# Patient Record
Sex: Male | Born: 1970 | Race: White | Hispanic: No | Marital: Married | Smoking: Former smoker
Health system: Southern US, Community
[De-identification: ages and names within clinical notes are randomized; demographics above are authoritative.]

## PROBLEM LIST (undated history)

## (undated) DIAGNOSIS — I10 Essential (primary) hypertension: Secondary | ICD-10-CM

## (undated) DIAGNOSIS — J45909 Unspecified asthma, uncomplicated: Secondary | ICD-10-CM

## (undated) DIAGNOSIS — E785 Hyperlipidemia, unspecified: Secondary | ICD-10-CM

## (undated) DIAGNOSIS — G4730 Sleep apnea, unspecified: Secondary | ICD-10-CM

## (undated) HISTORY — PX: LIPOMA EXCISION: SHX5283

## (undated) HISTORY — DX: Sleep apnea, unspecified: G47.30

## (undated) HISTORY — PX: APPENDECTOMY: SHX54

## (undated) HISTORY — DX: Essential (primary) hypertension: I10

## (undated) HISTORY — PX: ANTERIOR CERVICAL DECOMP/DISCECTOMY FUSION: SHX1161

## (undated) HISTORY — DX: Unspecified asthma, uncomplicated: J45.909

## (undated) HISTORY — DX: Hyperlipidemia, unspecified: E78.5

## (undated) HISTORY — PX: NASAL SEPTUM SURGERY: SHX37

## (undated) HISTORY — PX: BACK SURGERY: SHX140

---

## 2004-04-22 ENCOUNTER — Emergency Department: Payer: Self-pay | Admitting: Unknown Physician Specialty

## 2004-07-05 ENCOUNTER — Ambulatory Visit: Payer: Self-pay | Admitting: Family Medicine

## 2006-06-05 ENCOUNTER — Ambulatory Visit: Payer: Self-pay | Admitting: Family Medicine

## 2009-08-22 ENCOUNTER — Ambulatory Visit: Payer: Self-pay | Admitting: Family Medicine

## 2009-09-23 ENCOUNTER — Ambulatory Visit: Payer: Self-pay | Admitting: Unknown Physician Specialty

## 2010-12-25 ENCOUNTER — Ambulatory Visit: Payer: Self-pay | Admitting: Internal Medicine

## 2011-11-30 HISTORY — PX: OTHER SURGICAL HISTORY: SHX169

## 2011-12-19 ENCOUNTER — Ambulatory Visit: Payer: Self-pay | Admitting: Cardiology

## 2013-10-09 ENCOUNTER — Ambulatory Visit: Payer: Self-pay

## 2014-11-16 ENCOUNTER — Ambulatory Visit (INDEPENDENT_AMBULATORY_CARE_PROVIDER_SITE_OTHER): Payer: Commercial Managed Care - PPO | Admitting: Family Medicine

## 2014-11-16 ENCOUNTER — Encounter: Payer: Self-pay | Admitting: Family Medicine

## 2014-11-16 VITALS — BP 136/84 | HR 65 | Temp 97.6°F | Ht 70.7 in | Wt 299.0 lb

## 2014-11-16 DIAGNOSIS — R9431 Abnormal electrocardiogram [ECG] [EKG]: Secondary | ICD-10-CM | POA: Insufficient documentation

## 2014-11-16 DIAGNOSIS — R42 Dizziness and giddiness: Secondary | ICD-10-CM | POA: Insufficient documentation

## 2014-11-16 NOTE — Assessment & Plan Note (Signed)
Non-specific. Possibly cardiac related. Possibly thyroid related, possibly due to his asthma. Will go back and see his cardiologist on Thursday- appointment made today. Await lab results, should be back tomorrow. Follow up pending cardiology appointment.

## 2014-11-16 NOTE — Assessment & Plan Note (Addendum)
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 Changed from last EKG we have on file which was from 2013- no other recent EKGs on file. States that he had a clean cath with Dr. Cassie FreerParachos in 2014. Given symptoms, we will get him back in to see Cardiology. Appointment scheduled for 11/18/14 at 10:00AM. Warning signs for which he should go to the ER discussed.

## 2014-11-16 NOTE — Progress Notes (Signed)
BP 136/84 mmHg  Pulse 65  Temp(Src) 97.6 F (36.4 C)  Ht 5' 10.7" (1.796 m)  Wt 299 lb (135.626 kg)  BMI 42.05 kg/m2  SpO2 97%   Subjective:    Patient ID: Adam Beard, male    DOB: Feb 14, 1970, 44 y.o.   MRN: [id number]  HPI: Adam Beard is a 44 y.o. male  Chief Complaint  Patient presents with  . Dizziness   DIZZINESS- Friday through Sunday, has had a cold that he thinks went into bronchitis for about the past month and has just not been feeling like himself. Notes that his wife is concerned and wanted him to come in and get checked out. Saw cardiology about 2 years ago and had a cath which was negative. Does note that both his sister and his brother have been diagnosed with hypothyroid and he doesn't know the last time he was checked. Duration: 3-5 days Description of symptoms: lightheaded and just not feeling good Duration of episode: minutes Dizziness frequency: no history of the same Provoking factors: not eating Aggravating factors:  none Triggered by rolling over in bed: no Triggered by bending over: no Aggravated by head movement: yes Aggravated by exertion, coughing, loud noises: yes Recent head injury: no Recent or current viral symptoms: yes History of vasovagal episodes: no Nausea: no Vomiting: no Tinnitus: yes Hearing loss: yes Aural fullness: yes Headache: yes Photophobia/phonophobia: yes Unsteady gait: no Postural instability: no Diplopia, dysarthria, dysphagia or weakness: no Related to exertion: no Pallor: no Diaphoresis: yes Dyspnea: yes Chest pain: yes- thinks that it is due to coughing, only happens with moving   Relevant past medical, surgical, family and social history reviewed and updated as indicated. Interim medical history since our last visit reviewed. Allergies and medications reviewed and updated.  Review of Systems  Constitutional: Positive for diaphoresis, activity change and fatigue. Negative for fever, chills, appetite  change and unexpected weight change.  HENT: Positive for hearing loss and tinnitus. Negative for congestion, dental problem, drooling, ear discharge, ear pain, facial swelling, mouth sores, nosebleeds, postnasal drip, rhinorrhea, sinus pressure, sneezing, sore throat, trouble swallowing and voice change.   Respiratory: Positive for cough, chest tightness, shortness of breath and wheezing. Negative for apnea, choking and stridor.   Cardiovascular: Positive for chest pain and palpitations. Negative for leg swelling.  Gastrointestinal: Negative.   Neurological: Positive for dizziness, weakness, light-headedness and headaches. Negative for tremors, seizures, syncope, facial asymmetry, speech difficulty and numbness.  Hematological: Negative.   Psychiatric/Behavioral: Negative.     Per HPI unless specifically indicated above     Objective:    BP 136/84 mmHg  Pulse 65  Temp(Src) 97.6 F (36.4 C)  Ht 5' 10.7" (1.796 m)  Wt 299 lb (135.626 kg)  BMI 42.05 kg/m2  SpO2 97%  Wt Readings from Last 3 Encounters:  11/16/14 299 lb (135.626 kg)  06/29/14 286 lb (129.729 kg)    Physical Exam  Constitutional: He is oriented to person, place, and time. He appears well-developed and well-nourished. No distress.  HENT:  Head: Normocephalic and atraumatic.  Right Ear: Hearing normal.  Left Ear: Hearing normal.  Nose: Nose normal.  Eyes: Conjunctivae and lids are normal. Right eye exhibits no discharge. Left eye exhibits no discharge. No scleral icterus.  Neck: Normal range of motion. Neck supple. No JVD present. No tracheal deviation present. No thyromegaly present.  Cardiovascular: Normal rate, regular rhythm, normal heart sounds and intact distal pulses.  Exam reveals no gallop and no friction  rub.   No murmur heard. Pulmonary/Chest: Effort normal. No accessory muscle usage or stridor. No respiratory distress. He has decreased breath sounds. He has no wheezes. He has no rhonchi. He has no rales.   Musculoskeletal: Normal range of motion.  Lymphadenopathy:    He has no cervical adenopathy.  Neurological: He is alert and oriented to person, place, and time.  Skin: Skin is intact. No rash noted.  Psychiatric: He has a normal mood and affect. His speech is normal and behavior is normal. Judgment and thought content normal. Cognition and memory are normal.    No results found for this or any previous visit.    Assessment & Plan:   Problem List Items Addressed This Visit      Other   Dizziness - Primary    Non-specific. Possibly cardiac related. Possibly thyroid related, possibly due to his asthma. Will go back and see his cardiologist on Thursday- appointment made today. Await lab results, should be back tomorrow. Follow up pending cardiology appointment.       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 Changed from last EKG we have on file which was from 2013- no other recent EKGs on file. States that he had a clean cath with Dr. Cassie FreerParachos in 2014. Given symptoms, we will get him back in to see Cardiology. Appointment scheduled for 11/18/14 at 10:00AM. Warning signs for which he should go to the ER discussed.           Follow up plan: Return Following cardiology appointment.

## 2014-11-17 ENCOUNTER — Telehealth: Payer: Self-pay

## 2014-11-17 LAB — COMPREHENSIVE METABOLIC PANEL
A/G RATIO: 2.6 — AB (ref 1.1–2.5)
ALBUMIN: 4.6 g/dL (ref 3.5–5.5)
ALT: 46 IU/L — ABNORMAL HIGH (ref 0–44)
AST: 23 IU/L (ref 0–40)
Alkaline Phosphatase: 67 IU/L (ref 39–117)
BUN / CREAT RATIO: 14 (ref 9–20)
BUN: 14 mg/dL (ref 6–24)
Bilirubin Total: 0.5 mg/dL (ref 0.0–1.2)
CALCIUM: 8.9 mg/dL (ref 8.7–10.2)
CO2: 23 mmol/L (ref 18–29)
Chloride: 105 mmol/L (ref 97–106)
Creatinine, Ser: 0.98 mg/dL (ref 0.76–1.27)
GFR, EST AFRICAN AMERICAN: 107 mL/min/{1.73_m2} (ref >59)
GFR, EST NON AFRICAN AMERICAN: 93 mL/min/{1.73_m2} (ref >59)
GLOBULIN, TOTAL: 1.8 g/dL (ref 1.5–4.5)
Glucose: 92 mg/dL (ref 65–99)
POTASSIUM: 4.8 mmol/L (ref 3.5–5.2)
SODIUM: 140 mmol/L (ref 136–144)
Total Protein: 6.4 g/dL (ref 6.0–8.5)

## 2014-11-17 LAB — CBC WITH DIFFERENTIAL/PLATELET
BASOS: 1 %
Basophils Absolute: 0 10*3/uL (ref 0.0–0.2)
EOS (ABSOLUTE): 0.3 10*3/uL (ref 0.0–0.4)
EOS: 5 %
HEMATOCRIT: 46.1 % (ref 37.5–51.0)
Hemoglobin: 15.4 g/dL (ref 12.6–17.7)
IMMATURE GRANULOCYTES: 0 %
Immature Grans (Abs): 0 10*3/uL (ref 0.0–0.1)
LYMPHS ABS: 2.5 10*3/uL (ref 0.7–3.1)
Lymphs: 44 %
MCH: 29.7 pg (ref 26.6–33.0)
MCHC: 33.4 g/dL (ref 31.5–35.7)
MCV: 89 fL (ref 79–97)
MONOS ABS: 0.4 10*3/uL (ref 0.1–0.9)
Monocytes: 6 %
NEUTROS PCT: 44 %
Neutrophils Absolute: 2.6 10*3/uL (ref 1.4–7.0)
Platelets: 195 10*3/uL (ref 150–379)
RBC: 5.18 x10E6/uL (ref 4.14–5.80)
RDW: 14.4 % (ref 12.3–15.4)
WBC: 5.8 10*3/uL (ref 3.4–10.8)

## 2014-11-17 LAB — TSH: TSH: 2.9 u[IU]/mL (ref 0.450–4.500)

## 2014-11-17 NOTE — Telephone Encounter (Signed)
I want to hold off on that until he sees his cardiologist. His bloodwork here was also normal. Thanks!

## 2014-11-17 NOTE — Telephone Encounter (Signed)
Called patient to notify him that Dr. Laural BenesJohnson wanted to wait on the medications until he sees the cardiologist and that his blood work results were also normal. Patient agreed and said thank you. I asked if he had any more questions and he denied.

## 2014-11-17 NOTE — Telephone Encounter (Signed)
Patient called, he states that there was to be some medications sent into [facility] for him, they are not there. Also he wants to know if he needs to make a follow up visit after he is seen by the cardiologist.

## 2014-11-18 ENCOUNTER — Telehealth: Payer: Self-pay

## 2014-11-18 MED ORDER — AZITHROMYCIN 250 MG PO TABS: ORAL_TABLET | ORAL | Status: DC

## 2014-11-18 MED ORDER — PREDNISONE 10 MG PO TABS
ORAL_TABLET | ORAL | Status: DC
Start: 2014-11-18 — End: 2015-01-11

## 2014-11-18 MED ORDER — ALBUTEROL SULFATE HFA 108 (90 BASE) MCG/ACT IN AERS: 2.0000 | INHALATION_SPRAY | Freq: Four times a day (QID) | RESPIRATORY_TRACT | Status: DC | PRN

## 2014-11-18 NOTE — Telephone Encounter (Signed)
Patient called he was seen by cardiology, there were no changes in the EKG from 3 years ago. The cardiologist would like him to check back in with him in 3 weeks. He states that it is fine to go ahead and treat him for the bronchitis.

## 2014-11-18 NOTE — Telephone Encounter (Signed)
Rxs sent to his pharmacy. We'll recheck in 2 weeks to make sure his breathing is sounding better.

## 2015-01-11 ENCOUNTER — Ambulatory Visit (INDEPENDENT_AMBULATORY_CARE_PROVIDER_SITE_OTHER): Payer: Commercial Managed Care - PPO | Admitting: Family Medicine

## 2015-01-11 ENCOUNTER — Encounter: Payer: Self-pay | Admitting: Family Medicine

## 2015-01-11 VITALS — BP 146/83 | HR 61 | Temp 98.2°F | Ht 71.3 in | Wt 296.0 lb

## 2015-01-11 DIAGNOSIS — J329 Chronic sinusitis, unspecified: Secondary | ICD-10-CM | POA: Insufficient documentation

## 2015-01-11 MED ORDER — AMOXICILLIN 875 MG PO TABS: 875.0000 mg | ORAL_TABLET | Freq: Two times a day (BID) | ORAL | Status: DC

## 2015-01-11 NOTE — Assessment & Plan Note (Signed)
Discussed sinusitis care and treatment will continue Allegra and Sudafed may add Tylenol Mucinex Discuss other supportive measures Discuss use of antibiotics take until gone

## 2015-01-11 NOTE — Progress Notes (Signed)
BP 146/83 mmHg  Pulse 61  Temp(Src) 98.2 F (36.8 C)  Ht 5' 11.3" (1.811 m)  Wt 296 lb (134.265 kg)  BMI 40.94 kg/m2  SpO2 97%   Subjective:    Patient ID: Adam Beard, male    DOB: [birth year], 44 y.o.   MRN: [id number]  HPI: Adam Beard is a 44 y.o. male  Chief Complaint  Patient presents with  . URI   agent with 4-5 days of sinus pressure congestion developing and getting worse with scratchy throat developing some cough. Throat without swollen and tight Has been taking Allegra and Sudafed as been taking ibuprofen for sore throat  Relevant past medical, surgical, family and social history reviewed and updated as indicated. Interim medical history since our last visit reviewed. Allergies and medications reviewed and updated.  Review of Systems  Constitutional: Positive for chills, diaphoresis and fatigue. Negative for fever.  HENT: Positive for congestion, facial swelling, rhinorrhea, sinus pressure, sneezing and sore throat.        Ear fullness  Respiratory: Positive for cough and choking. Negative for chest tightness and wheezing.   Cardiovascular: Negative.     Per HPI unless specifically indicated above     Objective:    BP 146/83 mmHg  Pulse 61  Temp(Src) 98.2 F (36.8 C)  Ht 5' 11.3" (1.811 m)  Wt 296 lb (134.265 kg)  BMI 40.94 kg/m2  SpO2 97%  Wt Readings from Last 3 Encounters:  01/11/15 296 lb (134.265 kg)  11/16/14 299 lb (135.626 kg)  06/29/14 286 lb (129.729 kg)    Physical Exam  Constitutional: He is oriented to person, place, and time. He appears well-developed and well-nourished. No distress.  HENT:  Head: Normocephalic and atraumatic.  Right Ear: Hearing normal.  Left Ear: Hearing normal.  Nose: Nose normal.  Mouth/Throat: Oropharyngeal exudate present.  Eyes: Conjunctivae and lids are normal. Right eye exhibits no discharge. Left eye exhibits no discharge. No scleral icterus.  Cardiovascular: Normal rate, regular rhythm and  normal heart sounds.   Pulmonary/Chest: Effort normal and breath sounds normal. No respiratory distress.  Musculoskeletal: Normal range of motion.  Lymphadenopathy:    He has no cervical adenopathy.  Neurological: He is alert and oriented to person, place, and time.  Skin: Skin is intact. No rash noted.  Psychiatric: He has a normal mood and affect. His speech is normal and behavior is normal. Judgment and thought content normal. Cognition and memory are normal.    Results for orders placed or performed in visit on 11/16/14  CBC with Differential/Platelet  Result Value Ref Range   WBC 5.8 3.4 - 10.8 x10E3/uL   RBC 5.18 4.14 - 5.80 x10E6/uL   Hemoglobin 15.4 12.6 - 17.7 g/dL   Hematocrit 56.2 13.0 - 51.0 %   MCV 89 79 - 97 fL   MCH 29.7 26.6 - 33.0 pg   MCHC 33.4 31.5 - 35.7 g/dL   RDW 86.5 78.4 - 69.6 %   Platelets 195 150 - 379 x10E3/uL   Neutrophils 44 %   Lymphs 44 %   Monocytes 6 %   Eos 5 %   Basos 1 %   Neutrophils Absolute 2.6 1.4 - 7.0 x10E3/uL   Lymphocytes Absolute 2.5 0.7 - 3.1 x10E3/uL   Monocytes Absolute 0.4 0.1 - 0.9 x10E3/uL   EOS (ABSOLUTE) 0.3 0.0 - 0.4 x10E3/uL   Basophils Absolute 0.0 0.0 - 0.2 x10E3/uL   Immature Granulocytes 0 %   Immature Grans (Abs) 0.0  0.0 - 0.1 x10E3/uL  Comprehensive metabolic panel  Result Value Ref Range   Glucose 92 65 - 99 mg/dL   BUN 14 6 - 24 mg/dL   Creatinine, Ser 0.450.98 0.76 - 1.27 mg/dL   GFR calc non Af Amer 93 >59 mL/min/1.73   GFR calc Af Amer 107 >59 mL/min/1.73   BUN/Creatinine Ratio 14 9 - 20   Sodium 140 136 - 144 mmol/L   Potassium 4.8 3.5 - 5.2 mmol/L   Chloride 105 97 - 106 mmol/L   CO2 23 18 - 29 mmol/L   Calcium 8.9 8.7 - 10.2 mg/dL   Total Protein 6.4 6.0 - 8.5 g/dL   Albumin 4.6 3.5 - 5.5 g/dL   Globulin, Total 1.8 1.5 - 4.5 g/dL   Albumin/Globulin Ratio 2.6 (H) 1.1 - 2.5   Bilirubin Total 0.5 0.0 - 1.2 mg/dL   Alkaline Phosphatase 67 39 - 117 IU/L   AST 23 0 - 40 IU/L   ALT 46 (H) 0 - 44 IU/L   TSH  Result Value Ref Range   TSH 2.900 0.450 - 4.500 uIU/mL      Assessment & Plan:   Problem List Items Addressed This Visit      Respiratory   Sinusitis - Primary    Discussed sinusitis care and treatment will continue Allegra and Sudafed may add Tylenol Mucinex Discuss other supportive measures Discuss use of antibiotics take until gone      Relevant Medications   amoxicillin (AMOXIL) 875 MG tablet       Follow up plan: Return for Physical Exam appointment with Elnita Maxwellheryl.

## 2015-02-09 ENCOUNTER — Encounter: Payer: Commercial Managed Care - PPO | Admitting: Family Medicine

## 2015-02-22 ENCOUNTER — Encounter: Payer: Self-pay | Admitting: Family Medicine

## 2015-03-29 ENCOUNTER — Telehealth: Payer: Self-pay | Admitting: Family Medicine

## 2015-03-29 MED ORDER — AZITHROMYCIN 250 MG PO TABS: ORAL_TABLET | ORAL | Status: DC

## 2015-03-29 NOTE — Telephone Encounter (Signed)
Patient still feels that he may have bronchitis and wants to know if he can get antibiotic. Offer appointment but patient wanted me to send Dr. Dossie Beard a message first.

## 2015-04-11 ENCOUNTER — Telehealth: Payer: Self-pay | Admitting: Family Medicine

## 2015-04-11 NOTE — Telephone Encounter (Signed)
Patient will need to be seen.

## 2015-04-11 NOTE — Telephone Encounter (Signed)
Pt called in and would like to have something called in to [facility] for a sinus infection. He says he feels like he has the same thing he had when he came to see MAC on 01/11/15. While on the line i scheduled CPE for Friday.

## 2015-04-11 NOTE — Telephone Encounter (Signed)
Pt was unable to make an appt and said he will just try to wait it out.

## 2015-04-15 ENCOUNTER — Encounter: Payer: Commercial Managed Care - PPO | Admitting: Family Medicine

## 2015-06-07 ENCOUNTER — Ambulatory Visit
Admission: EM | Admit: 2015-06-07 | Discharge: 2015-06-07 | Disposition: A | Discharge status: Home / Self Care | Payer: Managed Care, Other (non HMO) | Attending: Family Medicine | Admitting: Family Medicine

## 2015-06-07 DIAGNOSIS — I10 Essential (primary) hypertension: Secondary | ICD-10-CM

## 2015-06-07 DIAGNOSIS — J0100 Acute maxillary sinusitis, unspecified: Secondary | ICD-10-CM | POA: Diagnosis not present

## 2015-06-07 LAB — RAPID STREP SCREEN (MED CTR MEBANE ONLY): Streptococcus, Group A Screen (Direct): NEGATIVE

## 2015-06-07 MED ORDER — AMOXICILLIN-POT CLAVULANATE 875-125 MG PO TABS: 1.0000 | ORAL_TABLET | Freq: Two times a day (BID) | ORAL | Status: DC

## 2015-06-07 MED ORDER — HYDROCOD POLST-CPM POLST ER 10-8 MG/5ML PO SUER: 5.0000 mL | Freq: Two times a day (BID) | ORAL | Status: DC

## 2015-06-07 NOTE — Discharge Instructions (Signed)
Sinus Rinse WHAT IS A SINUS RINSE? A sinus rinse is a simple home treatment that is used to rinse your sinuses with a sterile mixture of salt and water (saline solution). Sinuses are air-filled spaces in your skull behind the bones of your face and forehead that open into your nasal cavity. You will use the following:  Saline solution.  Neti pot or spray bottle. This releases the saline solution into your nose and through your sinuses. Neti pots and spray bottles can be purchased at your local pharmacy, a health food store, or online. WHEN WOULD I DO A SINUS RINSE? A sinus rinse can help to clear mucus, dirt, dust, or pollen from the nasal cavity. You may do a sinus rinse when you have a cold, a virus, nasal allergy symptoms, a sinus infection, or stuffiness in the nose or sinuses. If you are considering a sinus rinse:  Ask your child's health care provider before performing a sinus rinse on your child.  Do not do a sinus rinse if you have had ear or nasal surgery, ear infection, or blocked ears. HOW DO I DO A SINUS RINSE?  Wash your hands.  Disinfect your device according to the directions provided and then dry it.  Use the solution that comes with your device or one that is sold separately in stores. Follow the mixing directions on the package.  Fill your device with the amount of saline solution as directed by the device instructions.  Stand over a sink and tilt your head sideways over the sink.  Place the spout of the device in your upper nostril (the one closer to the ceiling).  Gently pour or squeeze the saline solution into the nasal cavity. The liquid should drain to the lower nostril if you are not overly congested.  Gently blow your nose. Blowing too hard may cause ear pain.  Repeat in the other nostril.  Clean and rinse your device with clean water and then air-dry it. ARE THERE RISKS OF A SINUS RINSE?  Sinus rinse is generally very safe and effective. However, there  are a few risks, which include:   A burning sensation in the sinuses. This may happen if you do not make the saline solution as directed. Make sure to follow all directions when making the saline solution.  Infection from contaminated water. This is rare, but possible.  Nasal irritation.   This information is not intended to replace advice given to you by your health care provider. Make sure you discuss any questions you have with your health care provider.   Document Released: 08/12/2013 Document Reviewed: 08/12/2013 Elsevier Interactive Patient Education 2016 [employer]  Sinusitis, Adult Sinusitis is redness, soreness, and inflammation of the paranasal sinuses. Paranasal sinuses are air pockets within the bones of your face. They are located beneath your eyes, in the middle of your forehead, and above your eyes. In healthy paranasal sinuses, mucus is able to drain out, and air is able to circulate through them by way of your nose. However, when your paranasal sinuses are inflamed, mucus and air can become trapped. This can allow bacteria and other germs to grow and cause infection. Sinusitis can develop quickly and last only a short time (acute) or continue over a long period (chronic). Sinusitis that lasts for more than 12 weeks is considered chronic. CAUSES Causes of sinusitis include:  Allergies.  Structural abnormalities, such as displacement of the cartilage that separates your nostrils (deviated septum), which can decrease the air flow   through your nose and sinuses and affect sinus drainage.  Functional abnormalities, such as when the small hairs (cilia) that line your sinuses and help remove mucus do not work properly or are not present. SIGNS AND SYMPTOMS Symptoms of acute and chronic sinusitis are the same. The primary symptoms are pain and pressure around the affected sinuses. Other symptoms include:  Upper toothache.  Earache.  Headache.  Bad breath.  Decreased  sense of smell and taste.  A cough, which worsens when you are lying flat.  Fatigue.  Fever.  Thick drainage from your nose, which often is green and may contain pus (purulent).  Swelling and warmth over the affected sinuses. DIAGNOSIS Your health care provider will perform a physical exam. During your exam, your health care provider may perform any of the following to help determine if you have acute sinusitis or chronic sinusitis:  Look in your nose for signs of abnormal growths in your nostrils (nasal polyps).  Tap over the affected sinus to check for signs of infection.  View the inside of your sinuses using an imaging device that has a light attached (endoscope). If your health care provider suspects that you have chronic sinusitis, one or more of the following tests may be recommended:  Allergy tests.  Nasal culture. A sample of mucus is taken from your nose, sent to a lab, and screened for bacteria.  Nasal cytology. A sample of mucus is taken from your nose and examined by your health care provider to determine if your sinusitis is related to an allergy. TREATMENT Most cases of acute sinusitis are related to a viral infection and will resolve on their own within 10 days. Sometimes, medicines are prescribed to help relieve symptoms of both acute and chronic sinusitis. These may include pain medicines, decongestants, nasal steroid sprays, or saline sprays. However, for sinusitis related to a bacterial infection, your health care provider will prescribe antibiotic medicines. These are medicines that will help kill the bacteria causing the infection. Rarely, sinusitis is caused by a fungal infection. In these cases, your health care provider will prescribe antifungal medicine. For some cases of chronic sinusitis, surgery is needed. Generally, these are cases in which sinusitis recurs more than 3 times per year, despite other treatments. HOME CARE INSTRUCTIONS  Drink plenty of  water. Water helps thin the mucus so your sinuses can drain more easily.  Use a humidifier.  Inhale steam 3-4 times a day (for example, sit in the bathroom with the shower running).  Apply a warm, moist washcloth to your face 3-4 times a day, or as directed by your health care provider.  Use saline nasal sprays to help moisten and clean your sinuses.  Take medicines only as directed by your health care provider.  If you were prescribed either an antibiotic or antifungal medicine, finish it all even if you start to feel better. SEEK IMMEDIATE MEDICAL CARE IF:  You have increasing pain or severe headaches.  You have nausea, vomiting, or drowsiness.  You have swelling around your face.  You have vision problems.  You have a stiff neck.  You have difficulty breathing.   This information is not intended to replace advice given to you by your health care provider. Make sure you discuss any questions you have with your health care provider.   Document Released: 01/15/2005 Document Revised: 02/05/2014 Document Reviewed: 01/30/2011 Elsevier Interactive Patient Education 2016 [employer]  

## 2015-06-07 NOTE — ED Provider Notes (Signed)
CSN: [id number]     Arrival date & time 06/07/15  4098 History   First MD Initiated Contact with Patient 06/07/15 0914     Chief Complaint  Patient presents with  . Sore Throat    Sinus sx x 2 weeks. Started on Sunday with left sided sore throat and into left ear. Pain 8/10. Low grade fever.    (Consider location/radiation/quality/duration/timing/severity/associated sxs/prior Treatment) HPI   This is a 45 year old male who presents with a two-week history of sinus symptoms with yellow blood-tinged mucus from his nose as well as coughing with yellow sputum production. Also complaining of left-sided sore throat into his left ear. His pain is an 8 out of 10. His wife who accompanies him states that he has been running a low-grade temperature at home and he does have 99.3 temperature today. He started experiencing left-sided pain in his throat just inferior to his ear and along the jawline. This started 2 days ago. He has had a 2 recent bouts of sinusitis and sore throat treated by Dr. Laural Benes his primary care physician.    Past Medical History  Diagnosis Date  . Hypertension   . Sleep apnea   . Hyperlipidemia   . Asthma    Past Surgical History  Procedure Laterality Date  . Appendectomy    . Back surgery      Ruptured Disk  . Nasal septum surgery    . Lipoma excision      Neck  . Heart catheter  11/2011   Family History  Problem Relation Age of Onset  . Hypertension Mother   . Cancer Father     Brain  . Hypertension Father   . Cancer Sister   . Hypertension Brother    Social History  Substance Use Topics  . Smoking status: Never Smoker   . Smokeless tobacco: Current User    Types: Chew  . Alcohol Use: Yes     Comment: one or less per year    Review of Systems  Constitutional: Positive for fever, activity change and fatigue. Negative for chills and appetite change.  HENT: Positive for congestion, ear pain, postnasal drip, rhinorrhea, sinus pressure and sore throat.    Respiratory: Positive for cough. Negative for shortness of breath, wheezing and stridor.   All other systems reviewed and are negative.   Allergies  Review of patient's allergies indicates no known allergies.  Home Medications   Prior to Admission medications   Medication Sig Start Date End Date Taking? Authorizing Provider  albuterol (PROVENTIL HFA;VENTOLIN HFA) 108 (90 BASE) MCG/ACT inhaler Inhale 2 puffs into the lungs every 6 (six) hours as needed for wheezing or shortness of breath. 11/18/14  Yes Megan P Johnson, DO  azelastine (ASTELIN) 0.1 % nasal spray Place into both nostrils 2 (two) times daily. Use in each nostril as directed   Yes Historical Provider, MD  fexofenadine (ALLEGRA) 180 MG tablet Take 180 mg by mouth daily.   Yes Historical Provider, MD  amoxicillin (AMOXIL) 875 MG tablet Take 1 tablet (875 mg total) by mouth 2 (two) times daily. 01/11/15   Steele Sizer, MD  amoxicillin-clavulanate (AUGMENTIN) 875-125 MG tablet Take 1 tablet by mouth every 12 (twelve) hours. 06/07/15   Lutricia Feil, PA-C  azithromycin (ZITHROMAX) 250 MG tablet 2 now then 1 a day 03/29/15   Steele Sizer, MD  chlorpheniramine-HYDROcodone [facility] PENNKINETIC ER) 10-8 MG/5ML SUER Take 5 mLs by mouth 2 (two) times daily. 06/07/15   Lutricia Feil,  PA-C  naproxen sodium (ANAPROX) 220 MG tablet Take 220 mg by mouth daily.    Historical Provider, MD   Meds Ordered and Administered this Visit  Medications - No data to display  BP 140/101 mmHg  Pulse 77  Temp(Src) 99.3 F (37.4 C) (Oral)  Resp 20  Ht 6\' 1"  (1.854 m)  Wt 290 lb (131.543 kg)  BMI 38.27 kg/m2  SpO2 98% No data found.   Physical Exam  Constitutional: He is oriented to person, place, and time. He appears well-developed and well-nourished. No distress.  HENT:  Head: Normocephalic and atraumatic.  Right Ear: External ear normal.  Left Ear: External ear normal.  Nose: Nose normal.  Mouth/Throat: Oropharynx is clear and  moist. No oropharyngeal exudate.  Eyes: Conjunctivae are normal. Pupils are equal, round, and reactive to light.  Neck: Normal range of motion. Neck supple.  Pulmonary/Chest: Effort normal and breath sounds normal. No respiratory distress. He has no wheezes. He has no rales.  Musculoskeletal: Normal range of motion. He exhibits no edema or tenderness.  Lymphadenopathy:    He has no cervical adenopathy.  Neurological: He is alert and oriented to person, place, and time.  Skin: Skin is warm and dry. He is not diaphoretic.  Psychiatric: He has a normal mood and affect. His behavior is normal. Judgment and thought content normal.  Nursing note and vitals reviewed.   ED Course  Procedures (including critical care time)  Labs Review Labs Reviewed  RAPID STREP SCREEN (NOT AT [facility])  CULTURE, GROUP A STREP [facility]    Imaging Review No results found.   Visual Acuity Review  Right Eye Distance:   Left Eye Distance:   Bilateral Distance:    Right Eye Near:   Left Eye Near:    Bilateral Near:         MDM   1. Acute maxillary sinusitis, recurrence not specified   2. Essential hypertension    Discharge Medication List as of 06/07/2015  9:58 AM    START taking these medications   Details  amoxicillin-clavulanate (AUGMENTIN) 875-125 MG tablet Take 1 tablet by mouth every 12 (twelve) hours., Starting 06/07/2015, Until Discontinued, Normal    chlorpheniramine-HYDROcodone (TUSSIONEX PENNKINETIC ER) 10-8 MG/5ML SUER Take 5 mLs by mouth 2 (two) times daily., Starting 06/07/2015, Until Discontinued, Print      Plan: 1. Test/x-ray results and diagnosis reviewed with patient 2. rx as per orders; risks, benefits, potential side effects reviewed with patient 3. Recommend supportive treatment with Rest and fluids. SHe has begun using Flonase which I have encouraged her about the next month. SHe also has been using sinus rinsing. I told her that since she's had frequent sinus infections she  should be evaluated by an ENT to see if there is a structural problem. SHe has a history of sinus surgery. SHe is not improving or is worsening she should follow-up with her primary care physician. 4. F/u prn if symptoms worsen or don't improve     Lutricia FeilWilliam P Roemer, PA-C 06/07/15 2042

## 2015-06-09 LAB — CULTURE, GROUP A STREP (THRC)

## 2017-01-17 ENCOUNTER — Encounter: Payer: Self-pay | Admitting: Family Medicine

## 2017-01-17 ENCOUNTER — Ambulatory Visit (INDEPENDENT_AMBULATORY_CARE_PROVIDER_SITE_OTHER): Payer: Managed Care, Other (non HMO) | Admitting: Family Medicine

## 2017-01-17 VITALS — BP 151/92 | HR 58 | Temp 98.9°F | Ht 72.0 in | Wt 314.0 lb

## 2017-01-17 DIAGNOSIS — R030 Elevated blood-pressure reading, without diagnosis of hypertension: Secondary | ICD-10-CM | POA: Diagnosis not present

## 2017-01-17 DIAGNOSIS — Z0000 Encounter for general adult medical examination without abnormal findings: Secondary | ICD-10-CM | POA: Diagnosis not present

## 2017-01-17 DIAGNOSIS — F4322 Adjustment disorder with anxiety: Secondary | ICD-10-CM

## 2017-01-17 DIAGNOSIS — Z13220 Encounter for screening for lipoid disorders: Secondary | ICD-10-CM

## 2017-01-17 MED ORDER — HYDROXYZINE HCL 25 MG PO TABS: 25.0000 mg | ORAL_TABLET | Freq: Three times a day (TID) | ORAL | 1 refills | Status: DC | PRN

## 2017-01-17 NOTE — Progress Notes (Signed)
BP (!) 151/92 (BP Location: Left Arm, Cuff Size: Large)   Pulse (!) 58   Temp 98.9 F (37.2 C)   Ht 6' (1.829 m)   Wt (!) 314 lb (142.4 kg)   SpO2 96%   BMI 42.59 kg/m    Subjective:    Patient ID: Adam Beard, male    DOB: 23-Sep-1970, 46 y.o.   MRN: [id number]  HPI: Adam Beard is a 46 y.o. male presenting on 01/17/2017 for comprehensive medical examination. Current medical complaints include:  ELEVATED BLOOD PRESSURE- just got back from a mens hunting retreat when he ate a lot of processed meat and ate crap Duration of elevated BP: unknown BP monitoring frequency: not checking Previous BP meds: no Recent stressors: yes Family history of hypertension: yes Recurrent headaches: no Visual changes: no Palpitations: no  Dyspnea: no Chest pain: no Lower extremity edema: no Dizzy/lightheaded: no Transient ischemic attacks: no  ANXIETY/STRESS- has trouble with crowds and loud places Duration: 8-9 months Status:exacerbated Anxious mood: yes  Excessive worrying: no Irritability: yes  Sweating: no Nausea: no Palpitations:yes Hyperventilation: no Panic attacks: no Agoraphobia: yes  Obscessions/compulsions: no Depressed mood: no Depression screen PHQ 2/9 01/17/2017  Decreased Interest 1  Down, Depressed, Hopeless 0  PHQ - 2 Score 1  Altered sleeping 1  Tired, decreased energy 1  Change in appetite 0  Feeling bad or failure about yourself  0  Trouble concentrating 0  Moving slowly or fidgety/restless 0  Suicidal thoughts 0  PHQ-9 Score 3  Difficult doing work/chores Somewhat difficult   Anhedonia: no Weight changes: yes Insomnia: yes hard to fall asleep and hard to stay asleep Hypersomnia: no Fatigue/loss of energy: yes Feelings of worthlessness: no Feelings of guilt: no Impaired concentration/indecisiveness: no Suicidal ideations: no  Crying spells: no Recent Stressors/Life Changes: yes   Relationship problems: no   Family stress: yes    Financial stress: no    Job stress: no    Recent death/loss: no  He currently lives with: wife Interim Problems from his last visit: no  Depression Screen done today and results listed below:  Depression screen [facility] 2/9 01/17/2017  Decreased Interest 1  Down, Depressed, Hopeless 0  PHQ - 2 Score 1  Altered sleeping 1  Tired, decreased energy 1  Change in appetite 0  Feeling bad or failure about yourself  0  Trouble concentrating 0  Moving slowly or fidgety/restless 0  Suicidal thoughts 0  PHQ-9 Score 3  Difficult doing work/chores Somewhat difficult    Past Medical History:  Past Medical History:  Diagnosis Date  . Asthma   . Hyperlipidemia   . Hypertension   . Sleep apnea     Surgical History:  Past Surgical History:  Procedure Laterality Date  . APPENDECTOMY    . BACK SURGERY     Ruptured Disk  . heart catheter  11/2011  . LIPOMA EXCISION     Neck  . NASAL SEPTUM SURGERY      Medications:  Current Outpatient Medications on File Prior to Visit  Medication Sig  . oxymetazoline (MUCINEX SINUS-MAX FULL FORCE) 0.05 % nasal spray Place 1 spray into both nostrils 2 (two) times daily.  Marland Kitchen albuterol (PROVENTIL HFA;VENTOLIN HFA) 108 (90 BASE) MCG/ACT inhaler Inhale 2 puffs into the lungs every 6 (six) hours as needed for wheezing or shortness of breath.  . fexofenadine (ALLEGRA) 180 MG tablet Take 180 mg by mouth daily.  . naproxen sodium (ANAPROX) 220 MG tablet Take  220 mg by mouth daily.   No current facility-administered medications on file prior to visit.     Allergies:  No Known Allergies  Social History:  Social History   Socioeconomic History  . Marital status: Married    Spouse name: Not on file  . Number of children: Not on file  . Years of education: Not on file  . Highest education level: Not on file  Social Needs  . Financial resource strain: Not on file  . Food insecurity - worry: Not on file  . Food insecurity - inability: Not on file  .  Transportation needs - medical: Not on file  . Transportation needs - non-medical: Not on file  Occupational History  . Not on file  Tobacco Use  . Smoking status: Never Smoker  . Smokeless tobacco: Current User    Types: Chew  Substance and Sexual Activity  . Alcohol use: Yes    Comment: one or less per year  . Drug use: No  . Sexual activity: Not on file  Other Topics Concern  . Not on file  Social History Narrative  . Not on file   Social History   Tobacco Use  Smoking Status Never Smoker  Smokeless Tobacco Current User  . Types: Chew   Social History   Substance and Sexual Activity  Alcohol Use Yes   Comment: one or less per year    Family History:  Family History  Problem Relation Age of Onset  . Hypertension Mother   . Cancer Father        Brain  . Hypertension Father   . Cancer Sister   . Hypertension Brother     Past medical history, surgical history, medications, allergies, family history and social history reviewed with patient today and changes made to appropriate areas of the chart.   Review of Systems  Constitutional: Negative.   HENT: Positive for tinnitus. Negative for congestion, ear discharge, ear pain, hearing loss, nosebleeds, sinus pain and sore throat.   Eyes: Negative.   Respiratory: Negative.  Negative for stridor.   Cardiovascular: Positive for palpitations. Negative for chest pain, orthopnea, claudication, leg swelling and PND.  Gastrointestinal: Positive for heartburn (with the recent trip). Negative for abdominal pain, blood in stool, constipation, diarrhea, melena, nausea and vomiting.  Genitourinary: Negative.   Musculoskeletal: Negative.   Skin: Negative.   Neurological: Positive for dizziness (laughing real hard makes him dizzy). Negative for tingling, tremors, sensory change, speech change, focal weakness, seizures, loss of consciousness and headaches.  Endo/Heme/Allergies: Negative.   Psychiatric/Behavioral: Negative for  depression, hallucinations, memory loss, substance abuse and suicidal ideas. The patient is nervous/anxious and has insomnia.     All other ROS negative except what is listed above and in the HPI.      Objective:    BP (!) 151/92 (BP Location: Left Arm, Cuff Size: Large)   Pulse (!) 58   Temp 98.9 F (37.2 C)   Ht 6' (1.829 m)   Wt (!) 314 lb (142.4 kg)   SpO2 96%   BMI 42.59 kg/m   Wt Readings from Last 3 Encounters:  01/17/17 (!) 314 lb (142.4 kg)  06/07/15 290 lb (131.5 kg)  01/11/15 296 lb (134.3 kg)    Physical Exam  Constitutional: He is oriented to person, place, and time. He appears well-developed and well-nourished. No distress.  HENT:  Head: Normocephalic and atraumatic.  Right Ear: Hearing, tympanic membrane, external ear and ear canal normal.  Left Ear:  Hearing, tympanic membrane, external ear and ear canal normal.  Nose: Nose normal.  Mouth/Throat: Uvula is midline, oropharynx is clear and moist and mucous membranes are normal. No oropharyngeal exudate.  Eyes: Conjunctivae, EOM and lids are normal. Pupils are equal, round, and reactive to light. Right eye exhibits no discharge. Left eye exhibits no discharge. No scleral icterus.  Neck: Normal range of motion. Neck supple. No JVD present. No tracheal deviation present. No thyromegaly present.  Cardiovascular: Normal rate, regular rhythm, normal heart sounds and intact distal pulses. Exam reveals no gallop and no friction rub.  No murmur heard. Pulmonary/Chest: Effort normal and breath sounds normal. No stridor. No respiratory distress. He has no wheezes. He has no rales. He exhibits no tenderness.  Abdominal: Soft. Bowel sounds are normal. He exhibits no distension and no mass. There is no tenderness. There is no rebound and no guarding. Hernia confirmed negative in the right inguinal area and confirmed negative in the left inguinal area.  Genitourinary: Testes normal and penis normal. Cremasteric reflex is present.  Right testis shows no mass, no swelling and no tenderness. Right testis is descended. Cremasteric reflex is not absent on the right side. Left testis shows no mass, no swelling and no tenderness. Left testis is descended. Cremasteric reflex is not absent on the left side. No phimosis, paraphimosis, hypospadias, penile erythema or penile tenderness. No discharge found.  Musculoskeletal: Normal range of motion. He exhibits no edema, tenderness or deformity.  Lymphadenopathy:    He has no cervical adenopathy.  Neurological: He is alert and oriented to person, place, and time. He has normal reflexes. He displays normal reflexes. No cranial nerve deficit. He exhibits normal muscle tone. Coordination normal.  Skin: Skin is warm, dry and intact. No rash noted. He is not diaphoretic. No erythema. No pallor.  Psychiatric: He has a normal mood and affect. His speech is normal and behavior is normal. Judgment and thought content normal. Cognition and memory are normal.  Nursing note and vitals reviewed.   Results for orders placed or performed during the hospital encounter of 06/07/15  Rapid strep screen  Result Value Ref Range   Streptococcus, Group A Screen (Direct) NEGATIVE NEGATIVE  Culture, group A strep  Result Value Ref Range   Specimen Description THROAT    Special Requests NONE Reflexed from [id number]    Culture NO BETA HEMOLYTIC STREPTOCOCCI ISOLATED    Report Status 06/09/2015 FINAL       Assessment & Plan:   Problem List Items Addressed This Visit    None    Visit Diagnoses    Routine general medical examination at a health care facility    -  Primary   Vaccines up to date. Screening labs checked today. Continue diet and exercise. Call with any concerns.    Relevant Orders   Bayer DCA Hb A1c Waived   CBC with Differential/Platelet   Comprehensive metabolic panel   Lipid Panel w/o Chol/HDL Ratio   Microalbumin, Urine Waived   Thyroid Panel With TSH   UA/M w/rflx Culture, Routine    Elevated blood pressure reading       Likely due to dietary indiscretions over the past couple of days- will work on [employer] diet and check microalbumin and recheck 1 month.    Relevant Orders   CBC with Differential/Platelet   Comprehensive metabolic panel   Microalbumin, Urine Waived   Thyroid Panel With TSH   Screening for cholesterol level       Lab to  be checked fasting. Orders in.    Relevant Orders   Lipid Panel w/o Chol/HDL Ratio   Adjustment disorder with anxious mood       Likely due to empty-nester. Will start breathing apps and hydroxyzine. Recheck 1 month.        Discussed aspirin prophylaxis for myocardial infarction prevention and decision was it was not indicated  LABORATORY TESTING:  Health maintenance labs ordered today as discussed above.   IMMUNIZATIONS:   - Tdap: Tetanus vaccination status reviewed: last tetanus booster within 10 years. - Influenza: Refused - Pneumovax: Not applicable  PATIENT COUNSELING:    Sexuality: Discussed sexually transmitted diseases, partner selection, use of condoms, avoidance of unintended pregnancy  and contraceptive alternatives.   Advised to avoid cigarette smoking.  I discussed with the patient that most people either abstain from alcohol or drink within safe limits (<=14/week and <=4 drinks/occasion for males, <=7/weeks and <= 3 drinks/occasion for females) and that the risk for alcohol disorders and other health effects rises proportionally with the number of drinks per week and how often a drinker exceeds daily limits.  Discussed cessation/primary prevention of drug use and availability of treatment for abuse.   Diet: Encouraged to adjust caloric intake to maintain  or achieve ideal body weight, to reduce intake of dietary saturated fat and total fat, to limit sodium intake by avoiding high sodium foods and not adding table salt, and to maintain adequate dietary potassium and calcium preferably from fresh fruits, vegetables, and  low-fat dairy products.    stressed the importance of regular exercise  Injury prevention: Discussed safety belts, safety helmets, smoke detector, smoking near bedding or upholstery.   Dental health: Discussed importance of regular tooth brushing, flossing, and dental visits.   Follow up plan: NEXT PREVENTATIVE PHYSICAL DUE IN 1 YEAR. Return in about 4 weeks (around 02/14/2017) for stress and blood pressure.

## 2017-01-17 NOTE — Patient Instructions (Addendum)
.The Mindfulness App.  .Headspace.  .Calm.  Marland Kitchen.MINDBODY.  .Buddhify.  .Insight Timer.  .Smiling Mind.  .Meditation Timer Pro.  Michaell Cowing.Aura    Health Maintenance, Male A healthy lifestyle and preventive care is important for your health and wellness. Ask your health care provider about what schedule of regular examinations is right for you. What should I know about weight and diet? Eat a Healthy Diet  Eat plenty of vegetables, fruits, whole grains, low-fat dairy products, and lean protein.  Do not eat a lot of foods high in solid fats, added sugars, or salt.  Maintain a Healthy Weight Regular exercise can help you achieve or maintain a healthy weight. You should:  Do at least 150 minutes of exercise each week. The exercise should increase your heart rate and make you sweat (moderate-intensity exercise).  Do strength-training exercises at least twice a week.  Watch Your Levels of Cholesterol and Blood Lipids  Have your blood tested for lipids and cholesterol every 5 years starting at 47 years of age. If you are at high risk for heart disease, you should start having your blood tested when you are 10541 years old. You may need to have your cholesterol levels checked more often if: ? Your lipid or cholesterol levels are high. ? You are older than 47 years of age. ? You are at high risk for heart disease.  What should I know about cancer screening? Many types of cancers can be detected early and may often be prevented. Lung Cancer  You should be screened every year for lung cancer if: ? You are a current smoker who has smoked for at least 30 years. ? You are a former smoker who has quit within the past 15 years.  Talk to your health care provider about your screening options, when you should start screening, and how often you should be screened.  Colorectal Cancer  Routine colorectal cancer screening usually begins at 47 years of age and should be repeated every 5-10 years until you are  47 years old. You may need to be screened more often if early forms of precancerous polyps or small growths are found. Your health care provider may recommend screening at an earlier age if you have risk factors for colon cancer.  Your health care provider may recommend using home test kits to check for hidden blood in the stool.  A small camera at the end of a tube can be used to examine your colon (sigmoidoscopy or colonoscopy). This checks for the earliest forms of colorectal cancer.  Prostate and Testicular Cancer  Depending on your age and overall health, your health care provider may do certain tests to screen for prostate and testicular cancer.  Talk to your health care provider about any symptoms or concerns you have about testicular or prostate cancer.  Skin Cancer  Check your skin from head to toe regularly.  Tell your health care provider about any new moles or changes in moles, especially if: ? There is a change in a mole's size, shape, or color. ? You have a mole that is larger than a pencil eraser.  Always use sunscreen. Apply sunscreen liberally and repeat throughout the day.  Protect yourself by wearing long sleeves, pants, a wide-brimmed hat, and sunglasses when outside.  What should I know about heart disease, diabetes, and high blood pressure?  If you are 2818-47 years of age, have your blood pressure checked every 3-5 years. If you are 47 years of age or older,  have your blood pressure checked every year. You should have your blood pressure measured twice-once when you are at a hospital or clinic, and once when you are not at a hospital or clinic. Record the average of the two measurements. To check your blood pressure when you are not at a hospital or clinic, you can use: ? An automated blood pressure machine at a pharmacy. ? A home blood pressure monitor.  Talk to your health care provider about your target blood pressure.  If you are between 55-52 years old, ask  your health care provider if you should take aspirin to prevent heart disease.  Have regular diabetes screenings by checking your fasting blood sugar level. ? If you are at a normal weight and have a low risk for diabetes, have this test once every three years after the age of 39. ? If you are overweight and have a high risk for diabetes, consider being tested at a younger age or more often.  A one-time screening for abdominal aortic aneurysm (AAA) by ultrasound is recommended for men aged 65-75 years who are current or former smokers. What should I know about preventing infection? Hepatitis B If you have a higher risk for hepatitis B, you should be screened for this virus. Talk with your health care provider to find out if you are at risk for hepatitis B infection. Hepatitis C Blood testing is recommended for:  Everyone born from [birth year] through 1966.  Anyone with known risk factors for hepatitis C.  Sexually Transmitted Diseases (STDs)  You should be screened each year for STDs including gonorrhea and chlamydia if: ? You are sexually active and are younger than 47 years of age. ? You are older than 47 years of age and your health care provider tells you that you are at risk for this type of infection. ? Your sexual activity has changed since you were last screened and you are at an increased risk for chlamydia or gonorrhea. Ask your health care provider if you are at risk.  Talk with your health care provider about whether you are at high risk of being infected with HIV. Your health care provider may recommend a prescription medicine to help prevent HIV infection.  What else can I do?  Schedule regular health, dental, and eye exams.  Stay current with your vaccines (immunizations).  Do not use any tobacco products, such as cigarettes, chewing tobacco, and e-cigarettes. If you need help quitting, ask your health care provider.  Limit alcohol intake to no more than 2 drinks per day.  One drink equals 12 ounces of beer, 5 ounces of wine, or 1 ounces of hard liquor.  Do not use street drugs.  Do not share needles.  Ask your health care provider for help if you need support or information about quitting drugs.  Tell your health care provider if you often feel depressed.  Tell your health care provider if you have ever been abused or do not feel safe at home. This information is not intended to replace advice given to you by your health care provider. Make sure you discuss any questions you have with your health care provider. Document Released: 07/14/2007 Document Revised: 09/14/2015 Document Reviewed: 10/19/2014 Elsevier Interactive Patient Education  2018 [employer].  DASH Eating Plan DASH stands for "Dietary Approaches to Stop Hypertension." The DASH eating plan is a healthy eating plan that has been shown to reduce high blood pressure (hypertension). It may also reduce your risk for type  2 diabetes, heart disease, and stroke. The DASH eating plan may also help with weight loss. What are tips for following this plan? General guidelines  Avoid eating more than 2,300 mg (milligrams) of salt (sodium) a day. If you have hypertension, you may need to reduce your sodium intake to 1,500 mg a day.  Limit alcohol intake to no more than 1 drink a day for nonpregnant women and 2 drinks a day for men. One drink equals 12 oz of beer, 5 oz of wine, or 1 oz of hard liquor.  Work with your health care provider to maintain a healthy body weight or to lose weight. Ask what an ideal weight is for you.  Get at least 30 minutes of exercise that causes your heart to beat faster (aerobic exercise) most days of the week. Activities may include walking, swimming, or biking.  Work with your health care provider or diet and nutrition specialist (dietitian) to adjust your eating plan to your individual calorie needs. Reading food labels  Check food labels for the amount of sodium per  serving. Choose foods with less than 5 percent of the Daily Value of sodium. Generally, foods with less than 300 mg of sodium per serving fit into this eating plan.  To find whole grains, look for the word "whole" as the first word in the ingredient list. Shopping  Buy products labeled as "low-sodium" or "no salt added."  Buy fresh foods. Avoid canned foods and premade or frozen meals. Cooking  Avoid adding salt when cooking. Use salt-free seasonings or herbs instead of table salt or sea salt. Check with your health care provider or pharmacist before using salt substitutes.  Do not fry foods. Cook foods using healthy methods such as baking, boiling, grilling, and broiling instead.  Cook with heart-healthy oils, such as olive, canola, soybean, or sunflower oil. Meal planning   Eat a balanced diet that includes: ? 5 or more servings of fruits and vegetables each day. At each meal, try to fill half of your plate with fruits and vegetables. ? Up to 6-8 servings of whole grains each day. ? Less than 6 oz of lean meat, poultry, or fish each day. A 3-oz serving of meat is about the same size as a deck of cards. One egg equals 1 oz. ? 2 servings of low-fat dairy each day. ? A serving of nuts, seeds, or beans 5 times each week. ? Heart-healthy fats. Healthy fats called Omega-3 fatty acids are found in foods such as flaxseeds and coldwater fish, like sardines, salmon, and mackerel.  Limit how much you eat of the following: ? Canned or prepackaged foods. ? Food that is high in trans fat, such as fried foods. ? Food that is high in saturated fat, such as fatty meat. ? Sweets, desserts, sugary drinks, and other foods with added sugar. ? Full-fat dairy products.  Do not salt foods before eating.  Try to eat at least 2 vegetarian meals each week.  Eat more home-cooked food and less restaurant, buffet, and fast food.  When eating at a restaurant, ask that your food be prepared with less salt  or no salt, if possible. What foods are recommended? The items listed may not be a complete list. Talk with your dietitian about what dietary choices are best for you. Grains Whole-grain or whole-wheat bread. Whole-grain or whole-wheat pasta. Brown rice. Orpah Cobb. Bulgur. Whole-grain and low-sodium cereals. Pita bread. Low-fat, low-sodium crackers. Whole-wheat flour tortillas. Vegetables Fresh or frozen vegetables (  raw, steamed, roasted, or grilled). Low-sodium or reduced-sodium tomato and vegetable juice. Low-sodium or reduced-sodium tomato sauce and tomato paste. Low-sodium or reduced-sodium canned vegetables. Fruits All fresh, dried, or frozen fruit. Canned fruit in natural juice (without added sugar). Meat and other protein foods Skinless chicken or Mal[place]iturkey. Ground chicken or [place]urkey. Pork with fat trimmed off. Fish and seafood. Egg whites. Dried beans, peas, or lentils. Unsalted nuts, nut butters, and seeds. Unsalted canned beans. Lean cuts of beef with fat trimmed off. Low-sodium, lean deli meat. Dairy Low-fat (1%) or fat-free (skim) milk. Fat-free, low-fat, or reduced-fat cheeses. Nonfat, low-sodium ricotta or cottage cheese. Low-fat or nonfat yogurt. Low-fat, low-sodium cheese. Fats and oils Soft margarine without trans fats. Vegetable oil. Low-fat, reduced-fat, or light mayonnaise and salad dressings (reduced-sodium). Canola, safflower, olive, soybean, and sunflower oils. Avocado. Seasoning and other foods Herbs. Spices. Seasoning mixes without salt. Unsalted popcorn and pretzels. Fat-free sweets. What foods are not recommended? The items listed may not be a complete list. Talk with your dietitian about what dietary choices are best for you. Grains Baked goods made with fat, such as croissants, muffins, or some breads. Dry pasta or rice meal packs. Vegetables Creamed or fried vegetables. Vegetables in a cheese sauce. Regular canned vegetables (not low-sodium or reduced-sodium).  Regular canned tomato sauce and paste (not low-sodium or reduced-sodium). Regular tomato and vegetable juice (not low-sodium or reduced-sodium). Rosita FirePickles. Olives. Fruits Canned fruit in a light or heavy syrup. Fried fruit. Fruit in cream or butter sauce. Meat and other protein foods Fatty cuts of meat. Ribs. Fried meat. Tomasa BlaseBacon. Sausage. Bologna and other processed lunch meats. Salami. Fatback. Hotdogs. Bratwurst. Salted nuts and seeds. Canned beans with added salt. Canned or smoked fish. Whole eggs or egg yolks. Chicken or [place]urkey with skin. Dairy Whole or 2% milk, cream, and half-and-half. Whole or full-fat cream cheese. Whole-fat or sweetened yogurt. Full-fat cheese. Nondairy creamers. Whipped toppings. Processed cheese and cheese spreads. Fats and oils Butter. Stick margarine. Lard. Shortening. Ghee. Bacon fat. Tropical oils, such as coconut, palm kernel, or palm oil. Seasoning and other foods Salted popcorn and pretzels. Onion salt, garlic salt, seasoned salt, table salt, and sea salt. Worcestershire sauce. Tartar sauce. Barbecue sauce. Teriyaki sauce. Soy sauce, including reduced-sodium. Steak sauce. Canned and packaged gravies. Fish sauce. Oyster sauce. Cocktail sauce. Horseradish that you find on the shelf. Ketchup. Mustard. Meat flavorings and tenderizers. Bouillon cubes. Hot sauce and Tabasco sauce. Premade or packaged marinades. Premade or packaged taco seasonings. Relishes. Regular salad dressings. Where to find more information:  National Heart, Lung, and Blood Institute: PopSteam.iswww.nhlbi.nih.gov  American Heart Association: www.heart.org Summary  The DASH eating plan is a healthy eating plan that has been shown to reduce high blood pressure (hypertension). It may also reduce your risk for type 2 diabetes, heart disease, and stroke.  With the DASH eating plan, you should limit salt (sodium) intake to 2,300 mg a day. If you have hypertension, you may need to reduce your sodium intake to 1,500 mg  a day.  When on the DASH eating plan, aim to eat more fresh fruits and vegetables, whole grains, lean proteins, low-fat dairy, and heart-healthy fats.  Work with your health care provider or diet and nutrition specialist (dietitian) to adjust your eating plan to your individual calorie needs. This information is not intended to replace advice given to you by your health care provider. Make sure you discuss any questions you have with your health care provider. Document Released: 01/04/2011 Document Revised: 01/09/2016  Document Reviewed: 01/09/2016 Elsevier Interactive Patient Education  [employer].

## 2017-01-30 ENCOUNTER — Other Ambulatory Visit: Payer: Managed Care, Other (non HMO)

## 2017-01-30 DIAGNOSIS — Z0000 Encounter for general adult medical examination without abnormal findings: Secondary | ICD-10-CM

## 2017-01-30 DIAGNOSIS — R030 Elevated blood-pressure reading, without diagnosis of hypertension: Secondary | ICD-10-CM

## 2017-01-30 DIAGNOSIS — Z13220 Encounter for screening for lipoid disorders: Secondary | ICD-10-CM

## 2017-01-30 LAB — UA/M W/RFLX CULTURE, ROUTINE
Bilirubin, UA: NEGATIVE
Glucose, UA: NEGATIVE
KETONES UA: NEGATIVE
LEUKOCYTES UA: NEGATIVE
Nitrite, UA: NEGATIVE
PH UA: 5 (ref 5.0–7.5)
Protein, UA: NEGATIVE
RBC UA: NEGATIVE
Specific Gravity, UA: 1.025 (ref 1.005–1.030)
UUROB: 0.2 mg/dL (ref 0.2–1.0)

## 2017-01-30 LAB — MICROALBUMIN, URINE WAIVED
CREATININE, URINE WAIVED: 300 mg/dL (ref 10–300)
Microalb, Ur Waived: 30 mg/L — ABNORMAL HIGH (ref 0–19)

## 2017-01-30 LAB — BAYER DCA HB A1C WAIVED: HB A1C: 5.5 % (ref <7.0)

## 2017-01-31 LAB — COMPREHENSIVE METABOLIC PANEL
ALBUMIN: 4.6 g/dL (ref 3.5–5.5)
ALK PHOS: 68 IU/L (ref 39–117)
ALT: 39 IU/L (ref 0–44)
AST: 25 IU/L (ref 0–40)
Albumin/Globulin Ratio: 1.9 (ref 1.2–2.2)
BUN / CREAT RATIO: 17 (ref 9–20)
BUN: 18 mg/dL (ref 6–24)
Bilirubin Total: 0.6 mg/dL (ref 0.0–1.2)
CO2: 22 mmol/L (ref 20–29)
CREATININE: 1.03 mg/dL (ref 0.76–1.27)
Calcium: 9.1 mg/dL (ref 8.7–10.2)
Chloride: 103 mmol/L (ref 96–106)
GFR calc Af Amer: 100 mL/min/{1.73_m2} (ref >59)
GFR calc non Af Amer: 86 mL/min/{1.73_m2} (ref >59)
GLOBULIN, TOTAL: 2.4 g/dL (ref 1.5–4.5)
Glucose: 118 mg/dL — ABNORMAL HIGH (ref 65–99)
Potassium: 4.1 mmol/L (ref 3.5–5.2)
SODIUM: 139 mmol/L (ref 134–144)
Total Protein: 7 g/dL (ref 6.0–8.5)

## 2017-01-31 LAB — LIPID PANEL W/O CHOL/HDL RATIO
Cholesterol, Total: 224 mg/dL — ABNORMAL HIGH (ref 100–199)
HDL: 37 mg/dL — AB (ref >39)
LDL Calculated: 129 mg/dL — ABNORMAL HIGH (ref 0–99)
TRIGLYCERIDES: 290 mg/dL — AB (ref 0–149)
VLDL CHOLESTEROL CAL: 58 mg/dL — AB (ref 5–40)

## 2017-01-31 LAB — CBC WITH DIFFERENTIAL/PLATELET
Basophils Absolute: 0 10*3/uL (ref 0.0–0.2)
Basos: 0 %
EOS (ABSOLUTE): 0.2 10*3/uL (ref 0.0–0.4)
EOS: 4 %
HEMATOCRIT: 46.6 % (ref 37.5–51.0)
Hemoglobin: 16.2 g/dL (ref 13.0–17.7)
IMMATURE GRANULOCYTES: 0 %
Immature Grans (Abs): 0 10*3/uL (ref 0.0–0.1)
LYMPHS ABS: 2.1 10*3/uL (ref 0.7–3.1)
Lymphs: 36 %
MCH: 29.7 pg (ref 26.6–33.0)
MCHC: 34.8 g/dL (ref 31.5–35.7)
MCV: 86 fL (ref 79–97)
MONOS ABS: 0.4 10*3/uL (ref 0.1–0.9)
Monocytes: 7 %
NEUTROS PCT: 53 %
Neutrophils Absolute: 3.2 10*3/uL (ref 1.4–7.0)
PLATELETS: 217 10*3/uL (ref 150–379)
RBC: 5.45 x10E6/uL (ref 4.14–5.80)
RDW: 14.7 % (ref 12.3–15.4)
WBC: 6 10*3/uL (ref 3.4–10.8)

## 2017-01-31 LAB — THYROID PANEL WITH TSH
FREE THYROXINE INDEX: 1.8 (ref 1.2–4.9)
T3 UPTAKE RATIO: 23 % — AB (ref 24–39)
T4, Total: 7.9 ug/dL (ref 4.5–12.0)
TSH: 3.96 u[IU]/mL (ref 0.450–4.500)

## 2017-02-01 ENCOUNTER — Encounter: Payer: Self-pay | Admitting: Family Medicine

## 2017-02-14 ENCOUNTER — Encounter: Payer: Self-pay | Admitting: Family Medicine

## 2017-02-14 ENCOUNTER — Ambulatory Visit (INDEPENDENT_AMBULATORY_CARE_PROVIDER_SITE_OTHER): Payer: Managed Care, Other (non HMO) | Admitting: Family Medicine

## 2017-02-14 VITALS — BP 156/90 | HR 58 | Wt 306.2 lb

## 2017-02-14 DIAGNOSIS — F4322 Adjustment disorder with anxiety: Secondary | ICD-10-CM | POA: Insufficient documentation

## 2017-02-14 DIAGNOSIS — R030 Elevated blood-pressure reading, without diagnosis of hypertension: Secondary | ICD-10-CM

## 2017-02-14 MED ORDER — LORAZEPAM 0.5 MG PO TABS: 0.5000 mg | ORAL_TABLET | Freq: Every day | ORAL | 1 refills | Status: DC | PRN

## 2017-02-14 NOTE — Progress Notes (Signed)
BP (!) 156/90 (BP Location: Left Arm, Cuff Size: Large)   Pulse (!) 58   Wt (!) 306 lb 3.2 oz (138.9 kg)   SpO2 98%   BMI 41.53 kg/m    Subjective:    Patient ID: Adam Beard, male    DOB: 03-10-1970, 47 y.o.   MRN: [id number]  HPI: Adam Beard is a 47 y.o. male  Chief Complaint  Patient presents with  . Hypertension  . Anxiety   ELEVATED BLOOD PRESSURE Duration of elevated BP: chronic BP monitoring frequency: not checking Previous BP meds: no Recent stressors: yes Family history of hypertension: yes Recurrent headaches: no Visual changes: no Palpitations: no  Dyspnea: no Chest pain: no Lower extremity edema: no Dizzy/lightheaded: no Transient ischemic attacks: no  Did not do well with hydroxyzine. It made him very sedated, so he didn't take it again. Still having issues with crowds and travel where he becomes really anxious. Only happens a couple of times a month. No other concerns.   Relevant past medical, surgical, family and social history reviewed and updated as indicated. Interim medical history since our last visit reviewed. Allergies and medications reviewed and updated.  Review of Systems  Constitutional: Negative.   Respiratory: Negative.   Cardiovascular: Negative.   Psychiatric/Behavioral: Negative for agitation, behavioral problems, confusion, decreased concentration, dysphoric mood, hallucinations, self-injury, sleep disturbance and suicidal ideas. The patient is nervous/anxious. The patient is not hyperactive.     Per HPI unless specifically indicated above     Objective:    BP (!) 156/90 (BP Location: Left Arm, Cuff Size: Large)   Pulse (!) 58   Wt (!) 306 lb 3.2 oz (138.9 kg)   SpO2 98%   BMI 41.53 kg/m   Wt Readings from Last 3 Encounters:  02/14/17 (!) 306 lb 3.2 oz (138.9 kg)  01/17/17 (!) 314 lb (142.4 kg)  06/07/15 290 lb (131.5 kg)    Physical Exam  Constitutional: He is oriented to person, place, and time. He appears  well-developed and well-nourished. No distress.  HENT:  Head: Normocephalic and atraumatic.  Right Ear: Hearing normal.  Left Ear: Hearing normal.  Nose: Nose normal.  Eyes: Conjunctivae and lids are normal. Right eye exhibits no discharge. Left eye exhibits no discharge. No scleral icterus.  Cardiovascular: Normal rate, regular rhythm, normal heart sounds and intact distal pulses. Exam reveals no gallop and no friction rub.  No murmur heard. Pulmonary/Chest: Effort normal and breath sounds normal. No respiratory distress. He has no wheezes. He has no rales. He exhibits no tenderness.  Musculoskeletal: Normal range of motion.  Neurological: He is alert and oriented to person, place, and time.  Skin: Skin is warm, dry and intact. No rash noted. He is not diaphoretic. No erythema. No pallor.  Psychiatric: He has a normal mood and affect. His speech is normal and behavior is normal. Judgment and thought content normal. Cognition and memory are normal.  Nursing note and vitals reviewed.   Results for orders placed or performed in visit on 01/30/17  UA/M w/rflx Culture, Routine  Result Value Ref Range   Specific Gravity, UA 1.025 1.005 - 1.030   pH, UA 5.0 5.0 - 7.5   Color, UA Orange Yellow   Appearance Ur Clear Clear   Leukocytes, UA Negative Negative   Protein, UA Negative Negative/Trace   Glucose, UA Negative Negative   Ketones, UA Negative Negative   RBC, UA Negative Negative   Bilirubin, UA Negative Negative   Urobilinogen,  Ur 0.2 0.2 - 1.0 mg/dL   Nitrite, UA Negative Negative  Thyroid Panel With TSH  Result Value Ref Range   TSH 3.960 0.450 - 4.500 uIU/mL   T4, Total 7.9 4.5 - 12.0 ug/dL   T3 Uptake Ratio 23 (L) 24 - 39 %   Free Thyroxine Index 1.8 1.2 - 4.9  Microalbumin, Urine Waived  Result Value Ref Range   Microalb, Ur Waived 30 (H) 0 - 19 mg/L   Creatinine, Urine Waived 300 10 - 300 mg/dL   Microalb/Creat Ratio <30 <30 mg/g  Lipid Panel w/o Chol/HDL Ratio  Result  Value Ref Range   Cholesterol, Total 224 (H) 100 - 199 mg/dL   Triglycerides 161 (H) 0 - 149 mg/dL   HDL 37 (L) >09 mg/dL   VLDL Cholesterol Cal 58 (H) 5 - 40 mg/dL   LDL Calculated 604 (H) 0 - 99 mg/dL  Comprehensive metabolic panel  Result Value Ref Range   Glucose 118 (H) 65 - 99 mg/dL   BUN 18 6 - 24 mg/dL   Creatinine, Ser 5.40 0.76 - 1.27 mg/dL   GFR calc non Af Amer 86 >59 mL/min/1.73   GFR calc Af Amer 100 >59 mL/min/1.73   BUN/Creatinine Ratio 17 9 - 20   Sodium 139 134 - 144 mmol/L   Potassium 4.1 3.5 - 5.2 mmol/L   Chloride 103 96 - 106 mmol/L   CO2 22 20 - 29 mmol/L   Calcium 9.1 8.7 - 10.2 mg/dL   Total Protein 7.0 6.0 - 8.5 g/dL   Albumin 4.6 3.5 - 5.5 g/dL   Globulin, Total 2.4 1.5 - 4.5 g/dL   Albumin/Globulin Ratio 1.9 1.2 - 2.2   Bilirubin Total 0.6 0.0 - 1.2 mg/dL   Alkaline Phosphatase 68 39 - 117 IU/L   AST 25 0 - 40 IU/L   ALT 39 0 - 44 IU/L  CBC with Differential/Platelet  Result Value Ref Range   WBC 6.0 3.4 - 10.8 x10E3/uL   RBC 5.45 4.14 - 5.80 x10E6/uL   Hemoglobin 16.2 13.0 - 17.7 g/dL   Hematocrit 98.1 19.1 - 51.0 %   MCV 86 79 - 97 fL   MCH 29.7 26.6 - 33.0 pg   MCHC 34.8 31.5 - 35.7 g/dL   RDW 47.8 29.5 - 62.1 %   Platelets 217 150 - 379 x10E3/uL   Neutrophils 53 Not Estab. %   Lymphs 36 Not Estab. %   Monocytes 7 Not Estab. %   Eos 4 Not Estab. %   Basos 0 Not Estab. %   Neutrophils Absolute 3.2 1.4 - 7.0 x10E3/uL   Lymphocytes Absolute 2.1 0.7 - 3.1 x10E3/uL   Monocytes Absolute 0.4 0.1 - 0.9 x10E3/uL   EOS (ABSOLUTE) 0.2 0.0 - 0.4 x10E3/uL   Basophils Absolute 0.0 0.0 - 0.2 x10E3/uL   Immature Granulocytes 0 Not Estab. %   Immature Grans (Abs) 0.0 0.0 - 0.1 x10E3/uL  Bayer DCA Hb A1c Waived  Result Value Ref Range   Bayer DCA Hb A1c Waived 5.5 <7.0 %      Assessment & Plan:   Problem List Items Addressed This Visit      Other   Adjustment disorder with anxious mood    Over sedated on the hydroxyzine. Will give him ativan  to help with crowds and travel. These 60 pills should last at least 6 months. Call with any concerns.        Other Visit Diagnoses    Elevated  blood pressure reading    -  Primary   Still elevated but down 8lbs. Will work on diet and exercise and recheck 3 months. Call with any concerns. Monitor at home and call if consistently elevated.       Follow up plan: Return in about 3 months (around 05/15/2017) for follow up bp.

## 2017-02-14 NOTE — Assessment & Plan Note (Signed)
Over sedated on the hydroxyzine. Will give him ativan to help with crowds and travel. These 60 pills should last at least 6 months. Call with any concerns.

## 2018-02-10 ENCOUNTER — Encounter: Payer: Self-pay | Admitting: Family Medicine

## 2018-02-10 ENCOUNTER — Ambulatory Visit (INDEPENDENT_AMBULATORY_CARE_PROVIDER_SITE_OTHER): Payer: Managed Care, Other (non HMO) | Admitting: Family Medicine

## 2018-02-10 VITALS — BP 131/88 | HR 65 | Temp 98.5°F | Ht 72.7 in | Wt 311.2 lb

## 2018-02-10 DIAGNOSIS — F4322 Adjustment disorder with anxiety: Secondary | ICD-10-CM

## 2018-02-10 DIAGNOSIS — Z0000 Encounter for general adult medical examination without abnormal findings: Secondary | ICD-10-CM

## 2018-02-10 LAB — UA/M W/RFLX CULTURE, ROUTINE
BILIRUBIN UA: NEGATIVE
Glucose, UA: NEGATIVE
Leukocytes, UA: NEGATIVE
Nitrite, UA: NEGATIVE
PROTEIN UA: NEGATIVE
RBC, UA: NEGATIVE
SPEC GRAV UA: 1.025 (ref 1.005–1.030)
Urobilinogen, Ur: 0.2 mg/dL (ref 0.2–1.0)
pH, UA: 5.5 (ref 5.0–7.5)

## 2018-02-10 LAB — BAYER DCA HB A1C WAIVED: HB A1C: 5.7 % (ref <7.0)

## 2018-02-10 MED ORDER — ALBUTEROL SULFATE HFA 108 (90 BASE) MCG/ACT IN AERS: 2.0000 | INHALATION_SPRAY | Freq: Four times a day (QID) | RESPIRATORY_TRACT | 0 refills | Status: DC | PRN

## 2018-02-10 MED ORDER — LORAZEPAM 0.5 MG PO TABS: 0.5000 mg | ORAL_TABLET | Freq: Every day | ORAL | 1 refills | Status: DC | PRN

## 2018-02-10 NOTE — Patient Instructions (Signed)

## 2018-02-10 NOTE — Assessment & Plan Note (Signed)
Daughter, son in law and grand son are living with him. Takes lorazepam very occasionally- still has some left over from a year ago. Refill given today. Call with any concerns.

## 2018-02-10 NOTE — Progress Notes (Signed)
BP 131/88 (BP Location: Left Arm, Cuff Size: Large)   Pulse 65   Temp 98.5 F (36.9 C) (Oral)   Ht 6' 0.7" (1.847 m)   Wt (!) 311 lb 3.2 oz (141.2 kg)   SpO2 96%   BMI 41.40 kg/m    Subjective:    Patient ID: Adam Beard, male    DOB: 02-26-1970, 48 y.o.   MRN: [id number]  HPI: Adam Beard is a 48 y.o. male presenting on 02/10/2018 for comprehensive medical examination. Current medical complaints include:none  Interim Problems from his last visit: no  Depression Screen done today and results listed below:  Depression screen [facility] 2/9 02/10/2018 01/17/2017  Decreased Interest 0 1  Down, Depressed, Hopeless 0 0  PHQ - 2 Score 0 1  Altered sleeping 0 1  Tired, decreased energy 0 1  Change in appetite 0 0  Feeling bad or failure about yourself  0 0  Trouble concentrating 0 0  Moving slowly or fidgety/restless 0 0  Suicidal thoughts 0 0  PHQ-9 Score 0 3  Difficult doing work/chores Not difficult at all Somewhat difficult    Past Medical History:  Past Medical History:  Diagnosis Date  . Asthma   . Hyperlipidemia   . Hypertension   . Sleep apnea     Surgical History:  Past Surgical History:  Procedure Laterality Date  . APPENDECTOMY    . BACK SURGERY     Ruptured Disk  . heart catheter  11/2011  . LIPOMA EXCISION     Neck  . NASAL SEPTUM SURGERY      Medications:  Current Outpatient Medications on File Prior to Visit  Medication Sig  . fexofenadine (ALLEGRA) 180 MG tablet Take 180 mg by mouth daily.  . naproxen sodium (ANAPROX) 220 MG tablet Take 220 mg by mouth daily.  Marland Kitchen oxymetazoline (MUCINEX SINUS-MAX FULL FORCE) 0.05 % nasal spray Place 1 spray into both nostrils 2 (two) times daily.   No current facility-administered medications on file prior to visit.     Allergies:  No Known Allergies  Social History:  Social History   Socioeconomic History  . Marital status: Married    Spouse name: Not on file  . Number of children: Not on file    . Years of education: Not on file  . Highest education level: Not on file  Occupational History  . Not on file  Social Needs  . Financial resource strain: Not on file  . Food insecurity:    Worry: Not on file    Inability: Not on file  . Transportation needs:    Medical: Not on file    Non-medical: Not on file  Tobacco Use  . Smoking status: Former Smoker    Last attempt to quit: 01/29/2006    Years since quitting: 12.0  . Smokeless tobacco: Current User    Types: Chew  Substance and Sexual Activity  . Alcohol use: Yes    Comment: one or less per year  . Drug use: No  . Sexual activity: Not on file  Lifestyle  . Physical activity:    Days per week: Not on file    Minutes per session: Not on file  . Stress: Not on file  Relationships  . Social connections:    Talks on phone: Not on file    Gets together: Not on file    Attends religious service: Not on file    Active member of club or organization: Not  on file    Attends meetings of clubs or organizations: Not on file    Relationship status: Not on file  . Intimate partner violence:    Fear of current or ex partner: Not on file    Emotionally abused: Not on file    Physically abused: Not on file    Forced sexual activity: Not on file  Other Topics Concern  . Not on file  Social History Narrative  . Not on file   Social History   Tobacco Use  Smoking Status Former Smoker  . Last attempt to quit: 01/29/2006  . Years since quitting: 12.0  Smokeless Tobacco Current User  . Types: Chew   Social History   Substance and Sexual Activity  Alcohol Use Yes   Comment: one or less per year    Family History:  Family History  Problem Relation Age of Onset  . Hypertension Mother   . Cancer Father        Brain  . Hypertension Father   . Cancer Sister   . Hypertension Brother     Past medical history, surgical history, medications, allergies, family history and social history reviewed with patient today and  changes made to appropriate areas of the chart.   Review of Systems  Constitutional: Negative.   HENT: Positive for hearing loss (L ear) and tinnitus (L ear- chronic). Negative for congestion, ear discharge, ear pain, nosebleeds, sinus pain and sore throat.   Eyes: Negative.        Saw a spot with waves around it, no flashes, then got a bad headache  Respiratory: Positive for cough. Negative for hemoptysis, sputum production, shortness of breath, wheezing and stridor.        Bronchitis since Christmas, starting to feel a little better   Cardiovascular: Negative.   Gastrointestinal: Negative.   Genitourinary: Negative.   Musculoskeletal: Negative.   Skin: Negative.   Neurological: Positive for headaches. Negative for dizziness, tingling, tremors, sensory change, speech change, focal weakness, seizures, loss of consciousness and weakness.  Endo/Heme/Allergies: Negative.   Psychiatric/Behavioral: Negative.     All other ROS negative except what is listed above and in the HPI.      Objective:    BP 131/88 (BP Location: Left Arm, Cuff Size: Large)   Pulse 65   Temp 98.5 F (36.9 C) (Oral)   Ht 6' 0.7" (1.847 m)   Wt (!) 311 lb 3.2 oz (141.2 kg)   SpO2 96%   BMI 41.40 kg/m   Wt Readings from Last 3 Encounters:  02/10/18 (!) 311 lb 3.2 oz (141.2 kg)  02/14/17 (!) 306 lb 3.2 oz (138.9 kg)  01/17/17 (!) 314 lb (142.4 kg)    Physical Exam Vitals signs and nursing note reviewed.  Constitutional:      General: He is not in acute distress.    Appearance: Normal appearance. He is obese. He is not ill-appearing, toxic-appearing or diaphoretic.  HENT:     Head: Normocephalic and atraumatic.     Right Ear: Tympanic membrane, ear canal and external ear normal. There is no impacted cerumen.     Left Ear: Tympanic membrane, ear canal and external ear normal. There is no impacted cerumen.     Nose: Nose normal. No congestion or rhinorrhea.     Mouth/Throat:     Mouth: Mucous membranes  are moist.     Pharynx: Oropharynx is clear. No oropharyngeal exudate or posterior oropharyngeal erythema.  Eyes:     General: No  scleral icterus.       Right eye: No discharge.        Left eye: No discharge.     Extraocular Movements: Extraocular movements intact.     Conjunctiva/sclera: Conjunctivae normal.     Pupils: Pupils are equal, round, and reactive to light.  Neck:     Musculoskeletal: Normal range of motion and neck supple. No neck rigidity or muscular tenderness.     Vascular: No carotid bruit.  Cardiovascular:     Rate and Rhythm: Normal rate and regular rhythm.     Pulses: Normal pulses.     Heart sounds: No murmur. No friction rub. No gallop.   Pulmonary:     Effort: Pulmonary effort is normal. No respiratory distress.     Breath sounds: Normal breath sounds. No stridor. No wheezing, rhonchi or rales.  Chest:     Chest wall: No tenderness.  Abdominal:     General: Abdomen is flat. Bowel sounds are normal. There is no distension.     Palpations: Abdomen is soft. There is no mass.     Tenderness: There is no abdominal tenderness. There is no right CVA tenderness, left CVA tenderness, guarding or rebound.     Hernia: No hernia is present.  Genitourinary:    Comments: Genital exam deferred with shared decision making Musculoskeletal:        General: No swelling, tenderness, deformity or signs of injury.     Right lower leg: No edema.     Left lower leg: No edema.  Lymphadenopathy:     Cervical: No cervical adenopathy.  Skin:    General: Skin is warm and dry.     Capillary Refill: Capillary refill takes less than 2 seconds.     Coloration: Skin is not jaundiced or pale.     Findings: No bruising, erythema, lesion or rash.  Neurological:     General: No focal deficit present.     Mental Status: He is alert and oriented to person, place, and time.     Cranial Nerves: No cranial nerve deficit.     Sensory: No sensory deficit.     Motor: No weakness.      Coordination: Coordination normal.     Gait: Gait normal.     Deep Tendon Reflexes: Reflexes normal.  Psychiatric:        Mood and Affect: Mood normal.        Behavior: Behavior normal.        Thought Content: Thought content normal.        Judgment: Judgment normal.     Results for orders placed or performed in visit on 01/30/17  UA/M w/rflx Culture, Routine  Result Value Ref Range   Specific Gravity, UA 1.025 1.005 - 1.030   pH, UA 5.0 5.0 - 7.5   Color, UA Orange Yellow   Appearance Ur Clear Clear   Leukocytes, UA Negative Negative   Protein, UA Negative Negative/Trace   Glucose, UA Negative Negative   Ketones, UA Negative Negative   RBC, UA Negative Negative   Bilirubin, UA Negative Negative   Urobilinogen, Ur 0.2 0.2 - 1.0 mg/dL   Nitrite, UA Negative Negative  Thyroid Panel With TSH  Result Value Ref Range   TSH 3.960 0.450 - 4.500 uIU/mL   T4, Total 7.9 4.5 - 12.0 ug/dL   T3 Uptake Ratio 23 (L) 24 - 39 %   Free Thyroxine Index 1.8 1.2 - 4.9  Microalbumin, Urine Waived  Result Value  Ref Range   Microalb, Ur Waived 30 (H) 0 - 19 mg/L   Creatinine, Urine Waived 300 10 - 300 mg/dL   Microalb/Creat Ratio <30 <30 mg/g  Lipid Panel w/o Chol/HDL Ratio  Result Value Ref Range   Cholesterol, Total 224 (H) 100 - 199 mg/dL   Triglycerides 161 (H) 0 - 149 mg/dL   HDL 37 (L) >09 mg/dL   VLDL Cholesterol Cal 58 (H) 5 - 40 mg/dL   LDL Calculated 604 (H) 0 - 99 mg/dL  Comprehensive metabolic panel  Result Value Ref Range   Glucose 118 (H) 65 - 99 mg/dL   BUN 18 6 - 24 mg/dL   Creatinine, Ser 5.40 0.76 - 1.27 mg/dL   GFR calc non Af Amer 86 >59 mL/min/1.73   GFR calc Af Amer 100 >59 mL/min/1.73   BUN/Creatinine Ratio 17 9 - 20   Sodium 139 134 - 144 mmol/L   Potassium 4.1 3.5 - 5.2 mmol/L   Chloride 103 96 - 106 mmol/L   CO2 22 20 - 29 mmol/L   Calcium 9.1 8.7 - 10.2 mg/dL   Total Protein 7.0 6.0 - 8.5 g/dL   Albumin 4.6 3.5 - 5.5 g/dL   Globulin, Total 2.4 1.5 - 4.5  g/dL   Albumin/Globulin Ratio 1.9 1.2 - 2.2   Bilirubin Total 0.6 0.0 - 1.2 mg/dL   Alkaline Phosphatase 68 39 - 117 IU/L   AST 25 0 - 40 IU/L   ALT 39 0 - 44 IU/L  CBC with Differential/Platelet  Result Value Ref Range   WBC 6.0 3.4 - 10.8 x10E3/uL   RBC 5.45 4.14 - 5.80 x10E6/uL   Hemoglobin 16.2 13.0 - 17.7 g/dL   Hematocrit 98.1 19.1 - 51.0 %   MCV 86 79 - 97 fL   MCH 29.7 26.6 - 33.0 pg   MCHC 34.8 31.5 - 35.7 g/dL   RDW 47.8 29.5 - 62.1 %   Platelets 217 150 - 379 x10E3/uL   Neutrophils 53 Not Estab. %   Lymphs 36 Not Estab. %   Monocytes 7 Not Estab. %   Eos 4 Not Estab. %   Basos 0 Not Estab. %   Neutrophils Absolute 3.2 1.4 - 7.0 x10E3/uL   Lymphocytes Absolute 2.1 0.7 - 3.1 x10E3/uL   Monocytes Absolute 0.4 0.1 - 0.9 x10E3/uL   EOS (ABSOLUTE) 0.2 0.0 - 0.4 x10E3/uL   Basophils Absolute 0.0 0.0 - 0.2 x10E3/uL   Immature Granulocytes 0 Not Estab. %   Immature Grans (Abs) 0.0 0.0 - 0.1 x10E3/uL  Bayer DCA Hb A1c Waived  Result Value Ref Range   HB A1C (BAYER DCA - WAIVED) 5.5 <7.0 %      Assessment & Plan:   Problem List Items Addressed This Visit      Other   Adjustment disorder with anxious mood    Daughter, son in law and grand son are living with him. Takes lorazepam very occasionally- still has some left over from a year ago. Refill given today. Call with any concerns.        Other Visit Diagnoses    Routine general medical examination at a health care facility    -  Primary   Vaccines up to date. Screening labs checked today. Continue diet and exercise. Call with any concerns.    Relevant Orders   CBC with Differential/Platelet   Bayer DCA Hb A1c Waived   Comprehensive metabolic panel   Lipid Panel w/o Chol/HDL Ratio  PSA   TSH   UA/M w/rflx Culture, Routine      LABORATORY TESTING:  Health maintenance labs ordered today as discussed above.   The natural history of prostate cancer and ongoing controversy regarding screening and potential  treatment outcomes of prostate cancer has been discussed with the patient. The meaning of a false positive PSA and a false negative PSA has been discussed. He indicates understanding of the limitations of this screening test and wishes to proceed with screening PSA testing.   IMMUNIZATIONS:   - Tdap: Tetanus vaccination status reviewed: last tetanus booster within 10 years. - Influenza: Refused - Pneumovax: Not applicable  PATIENT COUNSELING:    Sexuality: Discussed sexually transmitted diseases, partner selection, use of condoms, avoidance of unintended pregnancy  and contraceptive alternatives.   Advised to avoid cigarette smoking.  I discussed with the patient that most people either abstain from alcohol or drink within safe limits (<=14/week and <=4 drinks/occasion for males, <=7/weeks and <= 3 drinks/occasion for females) and that the risk for alcohol disorders and other health effects rises proportionally with the number of drinks per week and how often a drinker exceeds daily limits.  Discussed cessation/primary prevention of drug use and availability of treatment for abuse.   Diet: Encouraged to adjust caloric intake to maintain  or achieve ideal body weight, to reduce intake of dietary saturated fat and total fat, to limit sodium intake by avoiding high sodium foods and not adding table salt, and to maintain adequate dietary potassium and calcium preferably from fresh fruits, vegetables, and low-fat dairy products.    stressed the importance of regular exercise  Injury prevention: Discussed safety belts, safety helmets, smoke detector, smoking near bedding or upholstery.   Dental health: Discussed importance of regular tooth brushing, flossing, and dental visits.   Follow up plan: NEXT PREVENTATIVE PHYSICAL DUE IN 1 YEAR. Return in about 1 year (around 02/11/2019) for Physical.

## 2018-02-11 ENCOUNTER — Encounter: Payer: Self-pay | Admitting: Family Medicine

## 2018-02-11 LAB — CBC WITH DIFFERENTIAL/PLATELET
Basophils Absolute: 0 10*3/uL (ref 0.0–0.2)
Basos: 1 %
EOS (ABSOLUTE): 0.2 10*3/uL (ref 0.0–0.4)
EOS: 4 %
HEMATOCRIT: 45.9 % (ref 37.5–51.0)
Hemoglobin: 16 g/dL (ref 13.0–17.7)
IMMATURE GRANS (ABS): 0 10*3/uL (ref 0.0–0.1)
IMMATURE GRANULOCYTES: 0 %
Lymphocytes Absolute: 2.1 10*3/uL (ref 0.7–3.1)
Lymphs: 36 %
MCH: 29.7 pg (ref 26.6–33.0)
MCHC: 34.9 g/dL (ref 31.5–35.7)
MCV: 85 fL (ref 79–97)
Monocytes Absolute: 0.4 10*3/uL (ref 0.1–0.9)
Monocytes: 7 %
Neutrophils Absolute: 3.1 10*3/uL (ref 1.4–7.0)
Neutrophils: 52 %
Platelets: 232 10*3/uL (ref 150–450)
RBC: 5.38 x10E6/uL (ref 4.14–5.80)
RDW: 13.3 % (ref 11.6–15.4)
WBC: 5.9 10*3/uL (ref 3.4–10.8)

## 2018-02-11 LAB — LIPID PANEL W/O CHOL/HDL RATIO
Cholesterol, Total: 207 mg/dL — ABNORMAL HIGH (ref 100–199)
HDL: 36 mg/dL — ABNORMAL LOW (ref >39)
LDL CALC: 133 mg/dL — AB (ref 0–99)
TRIGLYCERIDES: 188 mg/dL — AB (ref 0–149)
VLDL Cholesterol Cal: 38 mg/dL (ref 5–40)

## 2018-02-11 LAB — COMPREHENSIVE METABOLIC PANEL
ALBUMIN: 4.7 g/dL (ref 3.5–5.5)
ALT: 54 IU/L — ABNORMAL HIGH (ref 0–44)
AST: 31 IU/L (ref 0–40)
Albumin/Globulin Ratio: 2 (ref 1.2–2.2)
Alkaline Phosphatase: 62 IU/L (ref 39–117)
BUN/Creatinine Ratio: 20 (ref 9–20)
BUN: 20 mg/dL (ref 6–24)
Bilirubin Total: 0.6 mg/dL (ref 0.0–1.2)
CALCIUM: 9.2 mg/dL (ref 8.7–10.2)
CO2: 19 mmol/L — AB (ref 20–29)
CREATININE: 1 mg/dL (ref 0.76–1.27)
Chloride: 104 mmol/L (ref 96–106)
GFR calc Af Amer: 102 mL/min/{1.73_m2} (ref >59)
GFR, EST NON AFRICAN AMERICAN: 89 mL/min/{1.73_m2} (ref >59)
GLOBULIN, TOTAL: 2.4 g/dL (ref 1.5–4.5)
Glucose: 87 mg/dL (ref 65–99)
Potassium: 4.4 mmol/L (ref 3.5–5.2)
SODIUM: 139 mmol/L (ref 134–144)
TOTAL PROTEIN: 7.1 g/dL (ref 6.0–8.5)

## 2018-02-11 LAB — PSA: PROSTATE SPECIFIC AG, SERUM: 0.4 ng/mL (ref 0.0–4.0)

## 2018-02-11 LAB — TSH: TSH: 2.74 u[IU]/mL (ref 0.450–4.500)

## 2018-03-07 ENCOUNTER — Telehealth: Payer: Self-pay | Admitting: Family Medicine

## 2018-03-07 NOTE — Telephone Encounter (Signed)
Copied from CRM [id number]. Topic: Quick Communication - See Telephone Encounter >> Mar 07, 2018 11:52 AM Adam Beard, Rosey Bath D wrote: CRM for notification. See Telephone encounter for: 03/07/18. Patient called and said that he saw Dr. Laural Benes a month ago for head cold congestion and still isn't feeling better. He would like to talk to her or her CMA before making an appointment. Please call patient back, thanks.

## 2018-03-10 NOTE — Telephone Encounter (Signed)
Called and spoke to patient. He states he was seen in January by Dr. Laural Benes, given a new inhaler. States the inhaler is not helping very much, still coughing. Clear drainage and phlegm, takes allegra everyday as well. Wants to know what he needs to do to take care of this.

## 2018-03-10 NOTE — Telephone Encounter (Signed)
If he is not feeling better- he should come in and we can see what we can do

## 2018-03-10 NOTE — Telephone Encounter (Signed)
Called and spoke to patient. Appointment scheduled for tomorrow with Dr. Laural Benes.

## 2018-03-11 ENCOUNTER — Ambulatory Visit (INDEPENDENT_AMBULATORY_CARE_PROVIDER_SITE_OTHER): Payer: Managed Care, Other (non HMO) | Admitting: Family Medicine

## 2018-03-11 ENCOUNTER — Encounter: Payer: Self-pay | Admitting: Family Medicine

## 2018-03-11 VITALS — BP 131/59 | HR 59 | Temp 98.4°F | Ht 72.72 in | Wt 307.4 lb

## 2018-03-11 DIAGNOSIS — J189 Pneumonia, unspecified organism: Secondary | ICD-10-CM | POA: Diagnosis not present

## 2018-03-11 MED ORDER — PREDNISONE 50 MG PO TABS: 50.0000 mg | ORAL_TABLET | Freq: Every day | ORAL | 0 refills | Status: DC

## 2018-03-11 MED ORDER — AZITHROMYCIN 250 MG PO TABS: ORAL_TABLET | ORAL | 0 refills | Status: DC

## 2018-03-11 NOTE — Progress Notes (Signed)
BP (!) 131/59   Pulse (!) 59   Temp 98.4 F (36.9 C) (Oral)   Ht 6' 0.72" (1.847 m)   Wt (!) 307 lb 6 oz (139.4 kg)   SpO2 97%   BMI 40.87 kg/m    Subjective:    Patient ID: Adam Beard, male    DOB: Oct 04, 1970, 48 y.o.   MRN: [id number]  HPI: Adam Beard is a 48 y.o. male  Chief Complaint  Patient presents with  . Cough    slightly improved since friday, waxing and waning     COUGH- had a cold back around Christmas, it has gotten better, but continuing with a cough. He notes that he started to get a little better on Friday and then it seemed to come back Duration: 1.5 months Circumstances of initial development of cough: URI Cough severity: moderate Cough description: dry and irritating Aggravating factors:  cold air and worse at night Alleviating factors: moist heat Status:  fluctuating Treatments attempted: mucinex and albuterol Wheezing: yes Shortness of breath: yes Chest pain: yes Chest tightness:no Nasal congestion: yes Runny nose: yes Postnasal drip: yes Frequent throat clearing or swallowing: yes Hemoptysis: no Fevers: no Night sweats: no Weight loss: no Heartburn: no Recent foreign travel: no Tuberculosis contacts: no   Relevant past medical, surgical, family and social history reviewed and updated as indicated. Interim medical history since our last visit reviewed. Allergies and medications reviewed and updated.  Review of Systems  Constitutional: Positive for fatigue. Negative for activity change, appetite change, chills, diaphoresis, fever and unexpected weight change.  HENT: Negative.  Negative for sneezing, sore throat, tinnitus, trouble swallowing and voice change.   Respiratory: Positive for cough, chest tightness and shortness of breath. Negative for apnea, choking, wheezing and stridor.   Cardiovascular: Negative.   Neurological: Negative.   Psychiatric/Behavioral: Negative.     Per HPI unless specifically indicated above     Objective:    BP (!) 131/59   Pulse (!) 59   Temp 98.4 F (36.9 C) (Oral)   Ht 6' 0.72" (1.847 m)   Wt (!) 307 lb 6 oz (139.4 kg)   SpO2 97%   BMI 40.87 kg/m   Wt Readings from Last 3 Encounters:  03/11/18 (!) 307 lb 6 oz (139.4 kg)  02/10/18 (!) 311 lb 3.2 oz (141.2 kg)  02/14/17 (!) 306 lb 3.2 oz (138.9 kg)    Physical Exam Vitals signs and nursing note reviewed.  Constitutional:      General: He is not in acute distress.    Appearance: Normal appearance. He is not ill-appearing, toxic-appearing or diaphoretic.  HENT:     Head: Normocephalic and atraumatic.     Right Ear: Tympanic membrane, ear canal and external ear normal. There is no impacted cerumen.     Left Ear: Tympanic membrane, ear canal and external ear normal. There is no impacted cerumen.     Nose: Congestion and rhinorrhea present.     Mouth/Throat:     Mouth: Mucous membranes are moist.     Pharynx: Oropharynx is clear. No oropharyngeal exudate or posterior oropharyngeal erythema.  Eyes:     General: No scleral icterus.       Right eye: No discharge.        Left eye: No discharge.     Extraocular Movements: Extraocular movements intact.     Conjunctiva/sclera: Conjunctivae normal.     Pupils: Pupils are equal, round, and reactive to light.  Neck:  Musculoskeletal: Normal range of motion and neck supple. No neck rigidity or muscular tenderness.     Vascular: No carotid bruit.  Cardiovascular:     Rate and Rhythm: Normal rate and regular rhythm.     Pulses: Normal pulses.     Heart sounds: Normal heart sounds. No murmur. No friction rub. No gallop.   Pulmonary:     Effort: Pulmonary effort is normal. No respiratory distress.     Breath sounds: Normal breath sounds. No stridor. No wheezing, rhonchi or rales.     Comments: Coarse breath sounds throughout Chest:     Chest wall: No tenderness.  Musculoskeletal: Normal range of motion.  Lymphadenopathy:     Cervical: Cervical adenopathy present.    Skin:    General: Skin is warm and dry.     Capillary Refill: Capillary refill takes less than 2 seconds.     Coloration: Skin is not jaundiced or pale.     Findings: No bruising, erythema, lesion or rash.  Neurological:     General: No focal deficit present.     Mental Status: He is alert and oriented to person, place, and time. Mental status is at baseline.     Cranial Nerves: No cranial nerve deficit.     Sensory: No sensory deficit.     Motor: No weakness.     Coordination: Coordination normal.     Gait: Gait normal.     Deep Tendon Reflexes: Reflexes normal.  Psychiatric:        Mood and Affect: Mood normal.        Behavior: Behavior normal.        Thought Content: Thought content normal.        Judgment: Judgment normal.     Results for orders placed or performed in visit on 02/10/18  CBC with Differential/Platelet  Result Value Ref Range   WBC 5.9 3.4 - 10.8 x10E3/uL   RBC 5.38 4.14 - 5.80 x10E6/uL   Hemoglobin 16.0 13.0 - 17.7 g/dL   Hematocrit 24.2 35.3 - 51.0 %   MCV 85 79 - 97 fL   MCH 29.7 26.6 - 33.0 pg   MCHC 34.9 31.5 - 35.7 g/dL   RDW 61.4 43.1 - 54.0 %   Platelets 232 150 - 450 x10E3/uL   Neutrophils 52 Not Estab. %   Lymphs 36 Not Estab. %   Monocytes 7 Not Estab. %   Eos 4 Not Estab. %   Basos 1 Not Estab. %   Neutrophils Absolute 3.1 1.4 - 7.0 x10E3/uL   Lymphocytes Absolute 2.1 0.7 - 3.1 x10E3/uL   Monocytes Absolute 0.4 0.1 - 0.9 x10E3/uL   EOS (ABSOLUTE) 0.2 0.0 - 0.4 x10E3/uL   Basophils Absolute 0.0 0.0 - 0.2 x10E3/uL   Immature Granulocytes 0 Not Estab. %   Immature Grans (Abs) 0.0 0.0 - 0.1 x10E3/uL  Bayer DCA Hb A1c Waived  Result Value Ref Range   HB A1C (BAYER DCA - WAIVED) 5.7 <7.0 %  Comprehensive metabolic panel  Result Value Ref Range   Glucose 87 65 - 99 mg/dL   BUN 20 6 - 24 mg/dL   Creatinine, Ser 0.86 0.76 - 1.27 mg/dL   GFR calc non Af Amer 89 >59 mL/min/1.73   GFR calc Af Amer 102 >59 mL/min/1.73   BUN/Creatinine Ratio  20 9 - 20   Sodium 139 134 - 144 mmol/L   Potassium 4.4 3.5 - 5.2 mmol/L   Chloride 104 96 - 106 mmol/L  CO2 19 (L) 20 - 29 mmol/L   Calcium 9.2 8.7 - 10.2 mg/dL   Total Protein 7.1 6.0 - 8.5 g/dL   Albumin 4.7 3.5 - 5.5 g/dL   Globulin, Total 2.4 1.5 - 4.5 g/dL   Albumin/Globulin Ratio 2.0 1.2 - 2.2   Bilirubin Total 0.6 0.0 - 1.2 mg/dL   Alkaline Phosphatase 62 39 - 117 IU/L   AST 31 0 - 40 IU/L   ALT 54 (H) 0 - 44 IU/L  Lipid Panel w/o Chol/HDL Ratio  Result Value Ref Range   Cholesterol, Total 207 (H) 100 - 199 mg/dL   Triglycerides 801 (H) 0 - 149 mg/dL   HDL 36 (L) >65 mg/dL   VLDL Cholesterol Cal 38 5 - 40 mg/dL   LDL Calculated 537 (H) 0 - 99 mg/dL  PSA  Result Value Ref Range   Prostate Specific Ag, Serum 0.4 0.0 - 4.0 ng/mL  TSH  Result Value Ref Range   TSH 2.740 0.450 - 4.500 uIU/mL  UA/M w/rflx Culture, Routine  Result Value Ref Range   Specific Gravity, UA 1.025 1.005 - 1.030   pH, UA 5.5 5.0 - 7.5   Color, UA Yellow Yellow   Appearance Ur Clear Clear   Leukocytes, UA Negative Negative   Protein, UA Negative Negative/Trace   Glucose, UA Negative Negative   Ketones, UA 1+ (A) Negative   RBC, UA Negative Negative   Bilirubin, UA Negative Negative   Urobilinogen, Ur 0.2 0.2 - 1.0 mg/dL   Nitrite, UA Negative Negative      Assessment & Plan:   Problem List Items Addressed This Visit    None    Visit Diagnoses    Atypical pneumonia    -  Primary   Will treat with prednisone and azithromycin. Call with any concerns. Continue to monitor.    Relevant Medications   azithromycin (ZITHROMAX) 250 MG tablet       Follow up plan: Return if symptoms worsen or fail to improve.

## 2018-09-28 ENCOUNTER — Encounter: Payer: Self-pay | Admitting: Family Medicine

## 2018-10-08 ENCOUNTER — Ambulatory Visit (INDEPENDENT_AMBULATORY_CARE_PROVIDER_SITE_OTHER): Payer: Managed Care, Other (non HMO) | Admitting: Family Medicine

## 2018-10-08 ENCOUNTER — Other Ambulatory Visit: Payer: Self-pay

## 2018-10-08 ENCOUNTER — Encounter: Payer: Self-pay | Admitting: Family Medicine

## 2018-10-08 VITALS — HR 57 | Wt 306.0 lb

## 2018-10-08 DIAGNOSIS — F4322 Adjustment disorder with anxiety: Secondary | ICD-10-CM

## 2018-10-08 MED ORDER — LORAZEPAM 0.5 MG PO TABS: 0.5000 mg | ORAL_TABLET | Freq: Every day | ORAL | 1 refills | Status: DC | PRN

## 2018-10-08 NOTE — Progress Notes (Signed)
Pulse (!) 57   Wt (!) 306 lb (138.8 kg)   BMI 40.69 kg/m    Subjective:    Patient ID: Adam Beard, male    DOB: 1970-11-21, 48 y.o.   MRN: [id number]  HPI: Adam Beard is a 48 y.o. male  Chief Complaint  Patient presents with  . Anxiety   ANXIETY/STRESS Duration:exacerbated Anxious mood: yes  Excessive worrying: yes Irritability: yes  Sweating: no Nausea: no Palpitations:no Hyperventilation: no Panic attacks: no Agoraphobia: no  Obscessions/compulsions: no Depressed mood: no [facility] 10/08/2018 02/10/2018 01/17/2017  Decreased Interest 0 0 1  Down, Depressed, Hopeless 0 0 0  PHQ - 2 Score 0 0 1  Altered sleeping 1 0 1  Tired, decreased energy 0 0 1  Change in appetite 0 0 0  Feeling bad or failure about yourself  0 0 0  Trouble concentrating 0 0 0  Moving slowly or fidgety/restless 0 0 0  Suicidal thoughts 0 0 0  PHQ-9 Score 1 0 3  Difficult doing work/chores Not difficult at all Not difficult at all Somewhat difficult   GAD 7 : Generalized Anxiety Score 10/08/2018  Nervous, Anxious, on Edge 1  Control/stop worrying 0  Worry too much - different things 0  Trouble relaxing 1  Restless 0  Easily annoyed or irritable 1  Afraid - awful might happen 0  Total GAD 7 Score 3  Anxiety Difficulty Somewhat difficult   Anhedonia: no Weight changes: no Insomnia: no   Hypersomnia: no Fatigue/loss of energy: yes Feelings of worthlessness: no Feelings of guilt: no Impaired concentration/indecisiveness: yes Suicidal ideations: no  Crying spells: no Recent Stressors/Life Changes: yes   Relationship problems: no   Family stress: yes     Financial stress: yes    Job stress: yes    Recent death/loss: no  Relevant past medical, surgical, family and social history reviewed and updated as indicated. Interim medical history since our last visit reviewed. Allergies and medications reviewed and updated.  Review of Systems  Constitutional:  Negative.   Respiratory: Negative.   Cardiovascular: Negative.   Gastrointestinal: Negative.   Skin: Negative.   Psychiatric/Behavioral: Negative for agitation, behavioral problems, confusion, decreased concentration, dysphoric mood, hallucinations, self-injury, sleep disturbance and suicidal ideas. The patient is nervous/anxious. The patient is not hyperactive.     Per HPI unless specifically indicated above     Objective:    Pulse (!) 57   Wt (!) 306 lb (138.8 kg)   BMI 40.69 kg/m   Wt Readings from Last 3 Encounters:  10/08/18 (!) 306 lb (138.8 kg)  03/11/18 (!) 307 lb 6 oz (139.4 kg)  02/10/18 (!) 311 lb 3.2 oz (141.2 kg)    Physical Exam Vitals signs and nursing note reviewed.  Constitutional:      General: He is not in acute distress.    Appearance: Normal appearance. He is not ill-appearing, toxic-appearing or diaphoretic.  HENT:     Head: Normocephalic and atraumatic.     Right Ear: External ear normal.     Left Ear: External ear normal.     Nose: Nose normal.     Mouth/Throat:     Mouth: Mucous membranes are moist.     Pharynx: Oropharynx is clear.  Eyes:     General: No scleral icterus.       Right eye: No discharge.        Left eye: No discharge.     Extraocular Movements: Extraocular movements  intact.     Conjunctiva/sclera: Conjunctivae normal.     Pupils: Pupils are equal, round, and reactive to light.  Neck:     Musculoskeletal: Normal range of motion and neck supple.  Cardiovascular:     Rate and Rhythm: Normal rate and regular rhythm.     Pulses: Normal pulses.     Heart sounds: Normal heart sounds. No murmur. No friction rub. No gallop.   Pulmonary:     Effort: Pulmonary effort is normal. No respiratory distress.     Breath sounds: Normal breath sounds. No stridor. No wheezing, rhonchi or rales.  Chest:     Chest wall: No tenderness.  Musculoskeletal: Normal range of motion.  Skin:    General: Skin is warm and dry.     Capillary Refill:  Capillary refill takes less than 2 seconds.     Coloration: Skin is not jaundiced or pale.     Findings: No bruising, erythema, lesion or rash.  Neurological:     General: No focal deficit present.     Mental Status: He is alert and oriented to person, place, and time. Mental status is at baseline.  Psychiatric:        Mood and Affect: Mood normal.        Behavior: Behavior normal.        Thought Content: Thought content normal.        Judgment: Judgment normal.     Results for orders placed or performed in visit on 02/10/18  CBC with Differential/Platelet  Result Value Ref Range   WBC 5.9 3.4 - 10.8 x10E3/uL   RBC 5.38 4.14 - 5.80 x10E6/uL   Hemoglobin 16.0 13.0 - 17.7 g/dL   Hematocrit 93.2 67.1 - 51.0 %   MCV 85 79 - 97 fL   MCH 29.7 26.6 - 33.0 pg   MCHC 34.9 31.5 - 35.7 g/dL   RDW 24.5 80.9 - 98.3 %   Platelets 232 150 - 450 x10E3/uL   Neutrophils 52 Not Estab. %   Lymphs 36 Not Estab. %   Monocytes 7 Not Estab. %   Eos 4 Not Estab. %   Basos 1 Not Estab. %   Neutrophils Absolute 3.1 1.4 - 7.0 x10E3/uL   Lymphocytes Absolute 2.1 0.7 - 3.1 x10E3/uL   Monocytes Absolute 0.4 0.1 - 0.9 x10E3/uL   EOS (ABSOLUTE) 0.2 0.0 - 0.4 x10E3/uL   Basophils Absolute 0.0 0.0 - 0.2 x10E3/uL   Immature Granulocytes 0 Not Estab. %   Immature Grans (Abs) 0.0 0.0 - 0.1 x10E3/uL  Bayer DCA Hb A1c Waived  Result Value Ref Range   HB A1C (BAYER DCA - WAIVED) 5.7 <7.0 %  Comprehensive metabolic panel  Result Value Ref Range   Glucose 87 65 - 99 mg/dL   BUN 20 6 - 24 mg/dL   Creatinine, Ser 3.82 0.76 - 1.27 mg/dL   GFR calc non Af Amer 89 >59 mL/min/1.73   GFR calc Af Amer 102 >59 mL/min/1.73   BUN/Creatinine Ratio 20 9 - 20   Sodium 139 134 - 144 mmol/L   Potassium 4.4 3.5 - 5.2 mmol/L   Chloride 104 96 - 106 mmol/L   CO2 19 (L) 20 - 29 mmol/L   Calcium 9.2 8.7 - 10.2 mg/dL   Total Protein 7.1 6.0 - 8.5 g/dL   Albumin 4.7 3.5 - 5.5 g/dL   Globulin, Total 2.4 1.5 - 4.5 g/dL    Albumin/Globulin Ratio 2.0 1.2 - 2.2   Bilirubin Total  0.6 0.0 - 1.2 mg/dL   Alkaline Phosphatase 62 39 - 117 IU/L   AST 31 0 - 40 IU/L   ALT 54 (H) 0 - 44 IU/L  Lipid Panel w/o Chol/HDL Ratio  Result Value Ref Range   Cholesterol, Total 207 (H) 100 - 199 mg/dL   Triglycerides 188 (H) 0 - 149 mg/dL   HDL 36 (L) >39 mg/dL   VLDL Cholesterol Cal 38 5 - 40 mg/dL   LDL Calculated 133 (H) 0 - 99 mg/dL  PSA  Result Value Ref Range   Prostate Specific Ag, Serum 0.4 0.0 - 4.0 ng/mL  TSH  Result Value Ref Range   TSH 2.740 0.450 - 4.500 uIU/mL  UA/M w/rflx Culture, Routine   Specimen: Urine   URINE  Result Value Ref Range   Specific Gravity, UA 1.025 1.005 - 1.030   pH, UA 5.5 5.0 - 7.5   Color, UA Yellow Yellow   Appearance Ur Clear Clear   Leukocytes, UA Negative Negative   Protein, UA Negative Negative/Trace   Glucose, UA Negative Negative   Ketones, UA 1+ (A) Negative   RBC, UA Negative Negative   Bilirubin, UA Negative Negative   Urobilinogen, Ur 0.2 0.2 - 1.0 mg/dL   Nitrite, UA Negative Negative      Assessment & Plan:   Problem List Items Addressed This Visit      Other   Adjustment disorder with anxious mood - Primary    Acting up because of the pandemic. Will continue PRN ativan- taking it very appropriately. Call with any concerns or if getting worse. Follow up with physical in January.          Follow up plan: Return As scheduled.    . This visit was completed via FaceTime due to the restrictions of the COVID-19 pandemic. All issues as above were discussed and addressed. Physical exam was done as above through visual confirmation on FaceTIme. If it was felt that the patient should be evaluated in the office, they were directed there. The patient verbally consented to this visit. . Location of the patient: home . Location of the provider: home . Those involved with this call:  . Provider: Park Liter, DO . CMA: Tiffany Reel, CMA . Front  Desk/Registration: Don Perking  . Time spent on call: 15 minutes with patient face to face via video conference. More than 50% of this time was spent in counseling and coordination of care. 23 minutes total spent in review of patient's record and preparation of their chart.

## 2018-10-08 NOTE — Assessment & Plan Note (Signed)
Acting up because of the pandemic. Will continue PRN ativan- taking it very appropriately. Call with any concerns or if getting worse. Follow up with physical in January.

## 2019-02-12 ENCOUNTER — Encounter: Payer: Managed Care, Other (non HMO) | Admitting: Family Medicine

## 2019-10-15 ENCOUNTER — Ambulatory Visit: Payer: No Typology Code available for payment source | Admitting: Family Medicine

## 2019-10-15 ENCOUNTER — Encounter: Payer: Self-pay | Admitting: Family Medicine

## 2019-10-15 ENCOUNTER — Other Ambulatory Visit: Payer: Self-pay

## 2019-10-15 VITALS — BP 136/89 | HR 75 | Temp 98.5°F | Ht 72.0 in | Wt 317.0 lb

## 2019-10-15 DIAGNOSIS — N63 Unspecified lump in unspecified breast: Secondary | ICD-10-CM

## 2019-10-15 DIAGNOSIS — L249 Irritant contact dermatitis, unspecified cause: Secondary | ICD-10-CM | POA: Diagnosis not present

## 2019-10-15 DIAGNOSIS — F4322 Adjustment disorder with anxiety: Secondary | ICD-10-CM | POA: Diagnosis not present

## 2019-10-15 MED ORDER — TRIAMCINOLONE ACETONIDE 0.5 % EX OINT: 1.0000 "application " | TOPICAL_OINTMENT | Freq: Two times a day (BID) | CUTANEOUS | 0 refills | Status: DC

## 2019-10-15 MED ORDER — ALBUTEROL SULFATE HFA 108 (90 BASE) MCG/ACT IN AERS: 2.0000 | INHALATION_SPRAY | Freq: Four times a day (QID) | RESPIRATORY_TRACT | 6 refills | Status: AC | PRN

## 2019-10-15 MED ORDER — LORAZEPAM 0.5 MG PO TABS: 0.5000 mg | ORAL_TABLET | Freq: Every day | ORAL | 1 refills | Status: DC | PRN

## 2019-10-15 NOTE — Progress Notes (Signed)
BP 136/89 (BP Location: Right Arm, Patient Position: Sitting)   Pulse 75   Temp 98.5 F (36.9 C) (Oral)   Ht 6' (1.829 m)   Wt (!) 317 lb (143.8 kg)   SpO2 96%   BMI 42.99 kg/m    Subjective:    Patient ID: Adam Beard, male    DOB: 05/08/1970, 49 y.o.   MRN: [id number]  HPI: Adam Beard is a 49 y.o. male  Chief Complaint  Patient presents with  . Rash    X3 days, left armpit, went away, a little sore  . Mass    X5-6 weeks, behind left nipple, painful to touch and without touching, burning sensation, hard, nickel size   RASH Duration:  3 days  Location: L axilla  Itching: no Burning: yes Redness: yes Oozing: no Scaling: no Blisters: no Painful: no Fevers: no Change in detergents/soaps/personal care products: yes Recent illness: no Recent travel:no History of same: no Context: better Alleviating factors: nothing Treatments attempted:nothing Shortness of breath: no  Throat/tongue swelling: no Myalgias/arthralgias: no   LUMP Duration: 5-6 weeks Location: under L areola Onset: unsure Painful: no Discomfort: yes Status:  bigger Trauma: no Redness: no Bruising: no Recent infection: no Swollen lymph nodes: no Requesting removal: no History of cancer: no Family history of cancer: no History of the same: no  Has had some soreness in the L arm and shoulder. Was seeing the chiropractor today and they have been helping with his neck. He did get sick around June 15. Still sick until about August. Finally feeling a little better  ANXIETY/STRESS Duration:stable Anxious mood: yes  Excessive worrying: yes Irritability: yes  Sweating: no Nausea: no Palpitations:no Hyperventilation: no Panic attacks: no Agoraphobia: no  Obscessions/compulsions: no Depressed mood: no Depression screen [facility] 2/9 10/15/2019 10/08/2018 02/10/2018 01/17/2017  Decreased Interest 0 0 0 1  Down, Depressed, Hopeless 0 0 0 0  PHQ - 2 Score 0 0 0 1  Altered sleeping 0 1 0 1   Tired, decreased energy 0 0 0 1  Change in appetite 0 0 0 0  Feeling bad or failure about yourself  0 0 0 0  Trouble concentrating 0 0 0 0  Moving slowly or fidgety/restless 0 0 0 0  Suicidal thoughts 0 0 0 0  PHQ-9 Score 0 1 0 3  Difficult doing work/chores Not difficult at all Not difficult at all Not difficult at all Somewhat difficult   Anhedonia: no Weight changes: no Insomnia: no   Hypersomnia: no Fatigue/loss of energy: yes Feelings of worthlessness: no Feelings of guilt: no Impaired concentration/indecisiveness: no Suicidal ideations: no  Crying spells: no Recent Stressors/Life Changes: yes   Relationship problems: no   Family stress: yes     Financial stress: yes    Job stress: yes    Recent death/loss: no   Relevant past medical, surgical, family and social history reviewed and updated as indicated. Interim medical history since our last visit reviewed. Allergies and medications reviewed and updated.  Review of Systems  Constitutional: Negative.   Respiratory: Negative.   Cardiovascular: Negative.   Gastrointestinal: Negative.   Musculoskeletal: Negative.   Neurological: Negative.   Psychiatric/Behavioral: Negative.     Per HPI unless specifically indicated above     Objective:    BP 136/89 (BP Location: Right Arm, Patient Position: Sitting)   Pulse 75   Temp 98.5 F (36.9 C) (Oral)   Ht 6' (1.829 m)   Wt (!) 317 lb (143.8  kg)   SpO2 96%   BMI 42.99 kg/m   Wt Readings from Last 3 Encounters:  10/15/19 (!) 317 lb (143.8 kg)  10/08/18 (!) 306 lb (138.8 kg)  03/11/18 (!) 307 lb 6 oz (139.4 kg)    Physical Exam Vitals and nursing note reviewed.  Constitutional:      General: He is not in acute distress.    Appearance: Normal appearance. He is not ill-appearing, toxic-appearing or diaphoretic.  HENT:     Head: Normocephalic and atraumatic.     Right Ear: External ear normal.     Left Ear: External ear normal.     Nose: Nose normal.      Mouth/Throat:     Mouth: Mucous membranes are moist.     Pharynx: Oropharynx is clear.  Eyes:     General: No scleral icterus.       Right eye: No discharge.        Left eye: No discharge.     Extraocular Movements: Extraocular movements intact.     Conjunctiva/sclera: Conjunctivae normal.     Pupils: Pupils are equal, round, and reactive to light.  Cardiovascular:     Rate and Rhythm: Normal rate and regular rhythm.     Pulses: Normal pulses.     Heart sounds: Normal heart sounds. No murmur heard.  No friction rub. No gallop.   Pulmonary:     Effort: Pulmonary effort is normal. No respiratory distress.     Breath sounds: Normal breath sounds. No stridor. No wheezing, rhonchi or rales.  Chest:     Chest wall: No tenderness.    Musculoskeletal:        General: Normal range of motion.     Cervical back: Normal range of motion and neck supple.  Skin:    General: Skin is warm and dry.     Capillary Refill: Capillary refill takes less than 2 seconds.     Coloration: Skin is not jaundiced or pale.     Findings: No bruising, erythema, lesion or rash.  Neurological:     General: No focal deficit present.     Mental Status: He is alert and oriented to person, place, and time. Mental status is at baseline.  Psychiatric:        Mood and Affect: Mood normal.        Behavior: Behavior normal.        Thought Content: Thought content normal.        Judgment: Judgment normal.     Results for orders placed or performed in visit on 02/10/18  CBC with Differential/Platelet  Result Value Ref Range   WBC 5.9 3.4 - 10.8 x10E3/uL   RBC 5.38 4.14 - 5.80 x10E6/uL   Hemoglobin 16.0 13.0 - 17.7 g/dL   Hematocrit 40.9 81.1 - 51.0 %   MCV 85 79 - 97 fL   MCH 29.7 26.6 - 33.0 pg   MCHC 34.9 31 - 35 g/dL   RDW 91.4 78.2 - 95.6 %   Platelets 232 150 - 450 x10E3/uL   Neutrophils 52 Not Estab. %   Lymphs 36 Not Estab. %   Monocytes 7 Not Estab. %   Eos 4 Not Estab. %   Basos 1 Not Estab. %    Neutrophils Absolute 3.1 1 - 7 x10E3/uL   Lymphocytes Absolute 2.1 0 - 3 x10E3/uL   Monocytes Absolute 0.4 0 - 0 x10E3/uL   EOS (ABSOLUTE) 0.2 0.0 - 0.4 x10E3/uL   Basophils Absolute  0.0 0 - 0 x10E3/uL   Immature Granulocytes 0 Not Estab. %   Immature Grans (Abs) 0.0 0.0 - 0.1 x10E3/uL  Bayer DCA Hb A1c Waived  Result Value Ref Range   HB A1C (BAYER DCA - WAIVED) 5.7 <7.0 %  Comprehensive metabolic panel  Result Value Ref Range   Glucose 87 65 - 99 mg/dL   BUN 20 6 - 24 mg/dL   Creatinine, Ser 7.16 0.76 - 1.27 mg/dL   GFR calc non Af Amer 89 >59 mL/min/1.73   GFR calc Af Amer 102 >59 mL/min/1.73   BUN/Creatinine Ratio 20 9 - 20   Sodium 139 134 - 144 mmol/L   Potassium 4.4 3.5 - 5.2 mmol/L   Chloride 104 96 - 106 mmol/L   CO2 19 (L) 20 - 29 mmol/L   Calcium 9.2 8.7 - 10.2 mg/dL   Total Protein 7.1 6.0 - 8.5 g/dL   Albumin 4.7 3.5 - 5.5 g/dL   Globulin, Total 2.4 1.5 - 4.5 g/dL   Albumin/Globulin Ratio 2.0 1.2 - 2.2   Bilirubin Total 0.6 0.0 - 1.2 mg/dL   Alkaline Phosphatase 62 39 - 117 IU/L   AST 31 0 - 40 IU/L   ALT 54 (H) 0 - 44 IU/L  Lipid Panel w/o Chol/HDL Ratio  Result Value Ref Range   Cholesterol, Total 207 (H) 100 - 199 mg/dL   Triglycerides 967 (H) 0 - 149 mg/dL   HDL 36 (L) >89 mg/dL   VLDL Cholesterol Cal 38 5 - 40 mg/dL   LDL Calculated 381 (H) 0 - 99 mg/dL  PSA  Result Value Ref Range   Prostate Specific Ag, Serum 0.4 0.0 - 4.0 ng/mL  TSH  Result Value Ref Range   TSH 2.740 0.450 - 4.500 uIU/mL  UA/M w/rflx Culture, Routine   Specimen: Urine   URINE  Result Value Ref Range   Specific Gravity, UA 1.025 1.005 - 1.030   pH, UA 5.5 5.0 - 7.5   Color, UA Yellow Yellow   Appearance Ur Clear Clear   Leukocytes, UA Negative Negative   Protein, UA Negative Negative/Trace   Glucose, UA Negative Negative   Ketones, UA 1+ (A) Negative   RBC, UA Negative Negative   Bilirubin, UA Negative Negative   Urobilinogen, Ur 0.2 0.2 - 1.0 mg/dL   Nitrite, UA  Negative Negative      Assessment & Plan:   Problem List Items Addressed This Visit      Other   Adjustment disorder with anxious mood    Due for refill on his lorazepam. Has not had a refill in 1 year. Use PRN. Call with any concerns. Continue to monitor.        Other Visit Diagnoses    Breast lump    -  Primary   Likely lipoma. Will get him in for mammgram and [place]. Await results. Treat as needed.    Relevant Orders   US BREAST LTD UNI LEFT INC AXILLA   MM DIAG BREAST TOMO BILATERAL   Irritant contact dermatitis, unspecified trigger       Resolved now. Call with any concerns or if it comes back. Continue to monitor.        Follow up plan: Return if symptoms worsen or fail to improve.

## 2019-10-16 ENCOUNTER — Encounter: Payer: Self-pay | Admitting: Family Medicine

## 2019-10-16 NOTE — Assessment & Plan Note (Signed)
Due for refill on his lorazepam. Has not had a refill in 1 year. Use PRN. Call with any concerns. Continue to monitor.

## 2019-10-22 ENCOUNTER — Ambulatory Visit
Admission: RE | Admit: 2019-10-22 | Discharge: 2019-10-22 | Disposition: A | Discharge status: Home / Self Care | Payer: No Typology Code available for payment source | Source: Ambulatory Visit | Attending: Family Medicine | Admitting: Family Medicine

## 2019-10-22 ENCOUNTER — Other Ambulatory Visit: Payer: Self-pay

## 2019-10-22 DIAGNOSIS — N63 Unspecified lump in unspecified breast: Secondary | ICD-10-CM

## 2019-10-29 ENCOUNTER — Other Ambulatory Visit: Payer: Self-pay

## 2019-10-29 ENCOUNTER — Encounter: Payer: Self-pay | Admitting: Family Medicine

## 2019-10-29 ENCOUNTER — Ambulatory Visit (INDEPENDENT_AMBULATORY_CARE_PROVIDER_SITE_OTHER): Payer: No Typology Code available for payment source | Admitting: Family Medicine

## 2019-10-29 VITALS — BP 133/89 | HR 67 | Temp 98.3°F | Ht 71.0 in | Wt 320.0 lb

## 2019-10-29 DIAGNOSIS — Z1211 Encounter for screening for malignant neoplasm of colon: Secondary | ICD-10-CM | POA: Diagnosis not present

## 2019-10-29 DIAGNOSIS — Z0000 Encounter for general adult medical examination without abnormal findings: Secondary | ICD-10-CM

## 2019-10-29 NOTE — Progress Notes (Signed)
BP 133/89   Pulse 67   Temp 98.3 F (36.8 C) (Oral)   Ht 5\' 11"  (1.803 m)   Wt (!) 320 lb (145.2 kg)   SpO2 95%   BMI 44.63 kg/m    Subjective:    Patient ID: Adam Beard, male    DOB: [birth year], 49 y.o.   MRN: [id number]  HPI: Adam Beard is a 49 y.o. male presenting on 10/29/2019 for comprehensive medical examination. Current medical complaints include:none  Interim Problems from his last visit: no  Depression Screen done today and results listed below:  Depression screen [facility] 2/9 10/29/2019 10/15/2019 10/08/2018 02/10/2018 01/17/2017  Decreased Interest 1 0 0 0 1  Down, Depressed, Hopeless 0 0 0 0 0  PHQ - 2 Score 1 0 0 0 1  Altered sleeping 1 0 1 0 1  Tired, decreased energy 1 0 0 0 1  Change in appetite 1 0 0 0 0  Feeling bad or failure about yourself  0 0 0 0 0  Trouble concentrating 0 0 0 0 0  Moving slowly or fidgety/restless 0 0 0 0 0  Suicidal thoughts 0 0 0 0 0  PHQ-9 Score 4 0 1 0 3  Difficult doing work/chores Not difficult at all Not difficult at all Not difficult at all Not difficult at all Somewhat difficult     Past Medical History:  Past Medical History:  Diagnosis Date  . Asthma   . Hyperlipidemia   . Hypertension   . Sleep apnea     Surgical History:  Past Surgical History:  Procedure Laterality Date  . APPENDECTOMY    . BACK SURGERY     Ruptured Disk  . heart catheter  11/2011  . LIPOMA EXCISION     Neck  . NASAL SEPTUM SURGERY      Medications:  Current Outpatient Medications on File Prior to Visit  Medication Sig  . albuterol (VENTOLIN HFA) 108 (90 Base) MCG/ACT inhaler Inhale 2 puffs into the lungs every 6 (six) hours as needed for wheezing or shortness of breath.  . fexofenadine (ALLEGRA) 180 MG tablet Take 180 mg by mouth daily.  12/2011 LORazepam (ATIVAN) 0.5 MG tablet Take 1 tablet (0.5 mg total) by mouth daily as needed for anxiety.  Marland Kitchen oxymetazoline (MUCINEX SINUS-MAX FULL FORCE) 0.05 % nasal spray Place 1 spray into both  nostrils 2 (two) times daily.  Marland Kitchen triamcinolone ointment (KENALOG) 0.5 % Apply 1 application topically 2 (two) times daily.   No current facility-administered medications on file prior to visit.    Allergies:  No Known Allergies  Social History:  Social History   Socioeconomic History  . Marital status: Married    Spouse name: Not on file  . Number of children: Not on file  . Years of education: Not on file  . Highest education level: Not on file  Occupational History  . Not on file  Tobacco Use  . Smoking status: Former Smoker    Quit date: 01/29/2006    Years since quitting: 13.7  . Smokeless tobacco: Current User    Types: Chew  Vaping Use  . Vaping Use: Never used  Substance and Sexual Activity  . Alcohol use: Yes    Comment: one or less per year  . Drug use: No  . Sexual activity: Not on file  Other Topics Concern  . Not on file  Social History Narrative  . Not on file   Social Determinants of Health  Financial Resource Strain:   . Difficulty of Paying Living Expenses: Not on file  Food Insecurity:   . Worried About Programme researcher, broadcasting/film/videounning Out of Food in the Last Year: Not on file  . Ran Out of Food in the Last Year: Not on file  Transportation Needs:   . Lack of Transportation (Medical): Not on file  . Lack of Transportation (Non-Medical): Not on file  Physical Activity:   . Days of Exercise per Week: Not on file  . Minutes of Exercise per Session: Not on file  Stress:   . Feeling of Stress : Not on file  Social Connections:   . Frequency of Communication with Friends and Family: Not on file  . Frequency of Social Gatherings with Friends and Family: Not on file  . Attends Religious Services: Not on file  . Active Member of Clubs or Organizations: Not on file  . Attends BankerClub or Organization Meetings: Not on file  . Marital Status: Not on file  Intimate Partner Violence:   . Fear of Current or Ex-Partner: Not on file  . Emotionally Abused: Not on file  . Physically  Abused: Not on file  . Sexually Abused: Not on file   Social History   Tobacco Use  Smoking Status Former Smoker  . Quit date: 01/29/2006  . Years since quitting: 13.7  Smokeless Tobacco Current User  . Types: Chew   Social History   Substance and Sexual Activity  Alcohol Use Yes   Comment: one or less per year    Family History:  Family History  Problem Relation Age of Onset  . Hypertension Mother   . Cancer Father        Brain  . Hypertension Father   . Cancer Sister   . Hypertension Brother     Past medical history, surgical history, medications, allergies, family history and social history reviewed with patient today and changes made to appropriate areas of the chart.   Review of Systems  Constitutional: Negative.   HENT: Positive for tinnitus (L ear). Negative for congestion, ear discharge, ear pain, hearing loss, nosebleeds, sinus pain and sore throat.   Eyes: Negative.   Respiratory: Negative.  Negative for stridor.   Cardiovascular: Negative.   Gastrointestinal: Negative.   Genitourinary: Negative.   Musculoskeletal: Positive for back pain. Negative for falls, joint pain, myalgias and neck pain.  Skin: Negative.   Neurological: Negative.   Endo/Heme/Allergies: Positive for environmental allergies. Negative for polydipsia. Does not bruise/bleed easily.  Psychiatric/Behavioral: Negative for depression, hallucinations, memory loss, substance abuse and suicidal ideas. The patient is nervous/anxious. The patient does not have insomnia.     All other ROS negative except what is listed above and in the HPI.      Objective:    BP 133/89   Pulse 67   Temp 98.3 F (36.8 C) (Oral)   Ht 5\' 11"  (1.803 m)   Wt (!) 320 lb (145.2 kg)   SpO2 95%   BMI 44.63 kg/m   Wt Readings from Last 3 Encounters:  10/29/19 (!) 320 lb (145.2 kg)  10/15/19 (!) 317 lb (143.8 kg)  10/08/18 (!) 306 lb (138.8 kg)    Physical Exam Vitals and nursing note reviewed.  Constitutional:       General: He is not in acute distress.    Appearance: Normal appearance. He is obese. He is not ill-appearing, toxic-appearing or diaphoretic.  HENT:     Head: Normocephalic and atraumatic.     Right Ear:  Tympanic membrane, ear canal and external ear normal. There is no impacted cerumen.     Left Ear: Tympanic membrane, ear canal and external ear normal. There is no impacted cerumen.     Nose: Nose normal. No congestion or rhinorrhea.     Mouth/Throat:     Mouth: Mucous membranes are moist.     Pharynx: Oropharynx is clear. No oropharyngeal exudate or posterior oropharyngeal erythema.  Eyes:     General: No scleral icterus.       Right eye: No discharge.        Left eye: No discharge.     Extraocular Movements: Extraocular movements intact.     Conjunctiva/sclera: Conjunctivae normal.     Pupils: Pupils are equal, round, and reactive to light.  Neck:     Vascular: No carotid bruit.  Cardiovascular:     Rate and Rhythm: Normal rate and regular rhythm.     Pulses: Normal pulses.     Heart sounds: No murmur heard.  No friction rub. No gallop.   Pulmonary:     Effort: Pulmonary effort is normal. No respiratory distress.     Breath sounds: Normal breath sounds. No stridor. No wheezing, rhonchi or rales.  Chest:     Chest wall: No tenderness.  Abdominal:     General: Abdomen is flat. Bowel sounds are normal. There is no distension.     Palpations: Abdomen is soft. There is no mass.     Tenderness: There is no abdominal tenderness. There is no right CVA tenderness, left CVA tenderness, guarding or rebound.     Hernia: No hernia is present.  Genitourinary:    Comments: Genital exam deferred with shared decision making Musculoskeletal:        General: No swelling, tenderness, deformity or signs of injury.     Cervical back: Normal range of motion and neck supple. No rigidity. No muscular tenderness.     Right lower leg: No edema.     Left lower leg: No edema.  Lymphadenopathy:      Cervical: No cervical adenopathy.  Skin:    General: Skin is warm and dry.     Capillary Refill: Capillary refill takes less than 2 seconds.     Coloration: Skin is not jaundiced or pale.     Findings: No bruising, erythema, lesion or rash.  Neurological:     General: No focal deficit present.     Mental Status: He is alert and oriented to person, place, and time.     Cranial Nerves: No cranial nerve deficit.     Sensory: No sensory deficit.     Motor: No weakness.     Coordination: Coordination normal.     Gait: Gait normal.     Deep Tendon Reflexes: Reflexes normal.  Psychiatric:        Mood and Affect: Mood normal.        Behavior: Behavior normal.        Thought Content: Thought content normal.        Judgment: Judgment normal.     Results for orders placed or performed in visit on 02/10/18  CBC with Differential/Platelet  Result Value Ref Range   WBC 5.9 3.4 - 10.8 x10E3/uL   RBC 5.38 4.14 - 5.80 x10E6/uL   Hemoglobin 16.0 13.0 - 17.7 g/dL   Hematocrit 16.1 09.6 - 51.0 %   MCV 85 79 - 97 fL   MCH 29.7 26.6 - 33.0 pg   MCHC 34.9 31 -  35 g/dL   RDW 53.2 99.2 - 42.6 %   Platelets 232 150 - 450 x10E3/uL   Neutrophils 52 Not Estab. %   Lymphs 36 Not Estab. %   Monocytes 7 Not Estab. %   Eos 4 Not Estab. %   Basos 1 Not Estab. %   Neutrophils Absolute 3.1 1 - 7 x10E3/uL   Lymphocytes Absolute 2.1 0 - 3 x10E3/uL   Monocytes Absolute 0.4 0 - 0 x10E3/uL   EOS (ABSOLUTE) 0.2 0.0 - 0.4 x10E3/uL   Basophils Absolute 0.0 0 - 0 x10E3/uL   Immature Granulocytes 0 Not Estab. %   Immature Grans (Abs) 0.0 0.0 - 0.1 x10E3/uL  Bayer DCA Hb A1c Waived  Result Value Ref Range   HB A1C (BAYER DCA - WAIVED) 5.7 <7.0 %  Comprehensive metabolic panel  Result Value Ref Range   Glucose 87 65 - 99 mg/dL   BUN 20 6 - 24 mg/dL   Creatinine, Ser 8.34 0.76 - 1.27 mg/dL   GFR calc non Af Amer 89 >59 mL/min/1.73   GFR calc Af Amer 102 >59 mL/min/1.73   BUN/Creatinine Ratio 20 9 - 20    Sodium 139 134 - 144 mmol/L   Potassium 4.4 3.5 - 5.2 mmol/L   Chloride 104 96 - 106 mmol/L   CO2 19 (L) 20 - 29 mmol/L   Calcium 9.2 8.7 - 10.2 mg/dL   Total Protein 7.1 6.0 - 8.5 g/dL   Albumin 4.7 3.5 - 5.5 g/dL   Globulin, Total 2.4 1.5 - 4.5 g/dL   Albumin/Globulin Ratio 2.0 1.2 - 2.2   Bilirubin Total 0.6 0.0 - 1.2 mg/dL   Alkaline Phosphatase 62 39 - 117 IU/L   AST 31 0 - 40 IU/L   ALT 54 (H) 0 - 44 IU/L  Lipid Panel w/o Chol/HDL Ratio  Result Value Ref Range   Cholesterol, Total 207 (H) 100 - 199 mg/dL   Triglycerides 196 (H) 0 - 149 mg/dL   HDL 36 (L) >22 mg/dL   VLDL Cholesterol Cal 38 5 - 40 mg/dL   LDL Calculated 297 (H) 0 - 99 mg/dL  PSA  Result Value Ref Range   Prostate Specific Ag, Serum 0.4 0.0 - 4.0 ng/mL  TSH  Result Value Ref Range   TSH 2.740 0.450 - 4.500 uIU/mL  UA/M w/rflx Culture, Routine   Specimen: Urine   URINE  Result Value Ref Range   Specific Gravity, UA 1.025 1.005 - 1.030   pH, UA 5.5 5.0 - 7.5   Color, UA Yellow Yellow   Appearance Ur Clear Clear   Leukocytes, UA Negative Negative   Protein, UA Negative Negative/Trace   Glucose, UA Negative Negative   Ketones, UA 1+ (A) Negative   RBC, UA Negative Negative   Bilirubin, UA Negative Negative   Urobilinogen, Ur 0.2 0.2 - 1.0 mg/dL   Nitrite, UA Negative Negative      Assessment & Plan:   Problem List Items Addressed This Visit    None    Visit Diagnoses    Routine general medical examination at a health care facility    -  Primary   Vaccines up to date/declined. Screening labs to be checked next week. Colonoscopy ordered. Continue diet and exercise. Call with any concerns.    Relevant Orders   Testosterone, free, total([facility])   CBC with Differential/Platelet   Comprehensive metabolic panel   Lipid Panel w/o Chol/HDL Ratio   PSA   TSH   Urinalysis,  Routine w reflex microscopic   Colon cancer screening       Colonoscopy ordered.    Relevant Orders   Ambulatory  referral to Gastroenterology       LABORATORY TESTING:  Health maintenance labs ordered today as discussed above.   The natural history of prostate cancer and ongoing controversy regarding screening and potential treatment outcomes of prostate cancer has been discussed with the patient. The meaning of a false positive PSA and a false negative PSA has been discussed. He indicates understanding of the limitations of this screening test and wishes  to proceed with screening PSA testing.   IMMUNIZATIONS:   - Tdap: Tetanus vaccination status reviewed: Declined. - Influenza: Refused - Pneumovax: Not applicable -COVID: Up to date  SCREENING: - Colonoscopy: Ordered today  Discussed with patient purpose of the colonoscopy is to detect colon cancer at curable precancerous or early stages   PATIENT COUNSELING:    Sexuality: Discussed sexually transmitted diseases, partner selection, use of condoms, avoidance of unintended pregnancy  and contraceptive alternatives.   Advised to avoid cigarette smoking.  I discussed with the patient that most people either abstain from alcohol or drink within safe limits (<=14/week and <=4 drinks/occasion for males, <=7/weeks and <= 3 drinks/occasion for females) and that the risk for alcohol disorders and other health effects rises proportionally with the number of drinks per week and how often a drinker exceeds daily limits.  Discussed cessation/primary prevention of drug use and availability of treatment for abuse.   Diet: Encouraged to adjust caloric intake to maintain  or achieve ideal body weight, to reduce intake of dietary saturated fat and total fat, to limit sodium intake by avoiding high sodium foods and not adding table salt, and to maintain adequate dietary potassium and calcium preferably from fresh fruits, vegetables, and low-fat dairy products.    stressed the importance of regular exercise  Injury prevention: Discussed safety belts, safety  helmets, smoke detector, smoking near bedding or upholstery.   Dental health: Discussed importance of regular tooth brushing, flossing, and dental visits.   Follow up plan: NEXT PREVENTATIVE PHYSICAL DUE IN 1 YEAR. Return in about 6 months (around 04/27/2020).

## 2019-10-29 NOTE — Patient Instructions (Signed)

## 2019-11-02 ENCOUNTER — Ambulatory Visit: Payer: No Typology Code available for payment source

## 2019-11-04 ENCOUNTER — Other Ambulatory Visit: Payer: Self-pay

## 2019-11-04 ENCOUNTER — Other Ambulatory Visit: Payer: No Typology Code available for payment source

## 2019-11-04 DIAGNOSIS — Z0000 Encounter for general adult medical examination without abnormal findings: Secondary | ICD-10-CM

## 2019-11-04 LAB — URINALYSIS, ROUTINE W REFLEX MICROSCOPIC
Bilirubin, UA: NEGATIVE
Glucose, UA: NEGATIVE
Ketones, UA: NEGATIVE
Leukocytes,UA: NEGATIVE
Nitrite, UA: NEGATIVE
Protein,UA: NEGATIVE
RBC, UA: NEGATIVE
Specific Gravity, UA: >1.03 — ABNORMAL HIGH (ref 1.005–1.030)
Urobilinogen, Ur: 0.2 mg/dL (ref 0.2–1.0)
pH, UA: 5 (ref 5.0–7.5)

## 2019-11-06 NOTE — Progress Notes (Signed)
Contacted via MyChart  Good evening [name], Dr. Laural Benes is out this week on vacation.  This is Adam Beard one of the NPs in office with your results.  We are waiting on Free Testosterone still.  Remainder of labs have returned.  A few concerning findings: - Glucose (sugar) is elevated, did you fast before labs?  If so then we may want to recheck next visit along with an A1C to ensure no diabetes. - One of your liver function tests was mildly elevated, ALT at 45, but improved from previous.  No changes needed. - Your cholesterol is high, but continued recommendations to make lifestyle changes. Your LDL is above normal. The LDL is the bad cholesterol. Over time and in combination with inflammation and other factors, this contributes to plaque which in turn may lead to stroke and/or heart attack down the road. Sometimes high LDL is primarily genetic, and people might be eating all the right foods but still have high numbers. Other times, there is room for improvement in one's diet and eating healthier can bring this number down and potentially reduce one's risk of heart attack and/or stroke.   To reduce your LDL, Remember - more fruits and vegetables, more fish, and limit red meat and dairy products. More soy, nuts, beans, barley, lentils, oats and plant sterol ester enriched margarine instead of butter. I also encourage eliminating sugar and processed food. Remember, shop on the outside of the grocery store and visit your [employer]. If you would like to talk with me about dietary changes for your cholesterol, please let us know.  Any questions?

## 2019-11-09 ENCOUNTER — Encounter: Payer: Self-pay | Admitting: *Deleted

## 2019-11-09 LAB — COMPREHENSIVE METABOLIC PANEL
ALT: 45 IU/L — ABNORMAL HIGH (ref 0–44)
AST: 26 IU/L (ref 0–40)
Albumin/Globulin Ratio: 1.8 (ref 1.2–2.2)
Albumin: 4.4 g/dL (ref 4.0–5.0)
Alkaline Phosphatase: 65 IU/L (ref 44–121)
BUN/Creatinine Ratio: 15 (ref 9–20)
BUN: 15 mg/dL (ref 6–24)
Bilirubin Total: 0.5 mg/dL (ref 0.0–1.2)
CO2: 24 mmol/L (ref 20–29)
Calcium: 9 mg/dL (ref 8.7–10.2)
Chloride: 105 mmol/L (ref 96–106)
Creatinine, Ser: 0.98 mg/dL (ref 0.76–1.27)
GFR calc Af Amer: 103 mL/min/{1.73_m2} (ref >59)
GFR calc non Af Amer: 90 mL/min/{1.73_m2} (ref >59)
Globulin, Total: 2.4 g/dL (ref 1.5–4.5)
Glucose: 127 mg/dL — ABNORMAL HIGH (ref 65–99)
Potassium: 4.6 mmol/L (ref 3.5–5.2)
Sodium: 142 mmol/L (ref 134–144)
Total Protein: 6.8 g/dL (ref 6.0–8.5)

## 2019-11-09 LAB — CBC WITH DIFFERENTIAL/PLATELET
Basophils Absolute: 0 10*3/uL (ref 0.0–0.2)
Basos: 1 %
EOS (ABSOLUTE): 0.3 10*3/uL (ref 0.0–0.4)
Eos: 4 %
Hematocrit: 47.2 % (ref 37.5–51.0)
Hemoglobin: 16 g/dL (ref 13.0–17.7)
Immature Grans (Abs): 0 10*3/uL (ref 0.0–0.1)
Immature Granulocytes: 0 %
Lymphocytes Absolute: 2.6 10*3/uL (ref 0.7–3.1)
Lymphs: 39 %
MCH: 29.8 pg (ref 26.6–33.0)
MCHC: 33.9 g/dL (ref 31.5–35.7)
MCV: 88 fL (ref 79–97)
Monocytes Absolute: 0.4 10*3/uL (ref 0.1–0.9)
Monocytes: 7 %
Neutrophils Absolute: 3.2 10*3/uL (ref 1.4–7.0)
Neutrophils: 49 %
Platelets: 215 10*3/uL (ref 150–450)
RBC: 5.37 x10E6/uL (ref 4.14–5.80)
RDW: 13.5 % (ref 11.6–15.4)
WBC: 6.5 10*3/uL (ref 3.4–10.8)

## 2019-11-09 LAB — TESTOSTERONE, FREE, TOTAL, SHBG
Sex Hormone Binding: 55.8 nmol/L (ref 19.3–76.4)
Testosterone, Free: 5.7 pg/mL — ABNORMAL LOW (ref 7.2–24.0)
Testosterone: 384 ng/dL (ref 264–916)

## 2019-11-09 LAB — PSA: Prostate Specific Ag, Serum: 0.7 ng/mL (ref 0.0–4.0)

## 2019-11-09 LAB — LIPID PANEL W/O CHOL/HDL RATIO
Cholesterol, Total: 200 mg/dL — ABNORMAL HIGH (ref 100–199)
HDL: 38 mg/dL — ABNORMAL LOW (ref >39)
LDL Chol Calc (NIH): 128 mg/dL — ABNORMAL HIGH (ref 0–99)
Triglycerides: 189 mg/dL — ABNORMAL HIGH (ref 0–149)
VLDL Cholesterol Cal: 34 mg/dL (ref 5–40)

## 2019-11-09 LAB — TSH: TSH: 2.44 u[IU]/mL (ref 0.450–4.500)

## 2019-11-13 ENCOUNTER — Ambulatory Visit: Payer: Self-pay | Admitting: *Deleted

## 2019-11-13 NOTE — Telephone Encounter (Signed)
Patient returned call as requested by Wilhemena Durie, CMA.  Patient advised that  "not feeling like himself" means that he is feeling extremely tired, fatigue and experiencing a low sex drive.  Patient states the triage nurse mentioned testosterone gel as a possibility and is requesting Dr. Laural Benes call him back to discuss.  Patient can be reached at [phone].

## 2019-11-13 NOTE — Telephone Encounter (Signed)
Routing to provider to advise on patient's concerns and possible medication request.

## 2019-11-13 NOTE — Telephone Encounter (Signed)
Called and LVM asking for patient to please return my call. Need to know what questions the patient has and what he means by "not feeling himself". How is he feeling? What kind of symptoms?

## 2019-11-13 NOTE — Telephone Encounter (Signed)
Pt wife , Andrey Campanile given lab results per notes of Tomie China, NP on 11/13/19. Pt wife  verbalized she would like PCP to call patient and review results due to multiple abnormal results. Patient's wife requesting to know if any new medications are needed and that patient is "not feeling himself". Patient's wife requesting call back prior to scheduling appt. Please advise.

## 2019-11-16 NOTE — Telephone Encounter (Signed)
Called and spoke with patient. Advised him of Dr. Henriette Combs message. Patient states that he would like to apologize to [place] for the way his wife called in and spoke to [place] on Friday. He states that he feels like she caused all of this but thanked me for calling with his results.

## 2019-11-16 NOTE — Telephone Encounter (Signed)
His testosterone was entirely normal. He doesn't qualify for a testosterone gel. His other labs were actually better than the last time we checked them. His cholesterol and liver function have both improved. We can discuss other issues that may be making him tired, but he does not have low testosterone.

## 2020-04-27 ENCOUNTER — Ambulatory Visit: Payer: No Typology Code available for payment source | Admitting: Family Medicine

## 2020-12-16 IMAGING — MG DIGITAL DIAGNOSTIC BILAT W/ TOMO W/ CAD
6 of 10 series · 6 of 30 positions shown · non-contrast
Comparison: None.

ACR Breast Density Category a: The breast tissue is almost entirely
fatty.

CLINICAL DATA: Palpable lump in the left breast

EXAM:
DIGITAL DIAGNOSTIC BILATERAL MAMMOGRAM WITH TOMO AND CAD

[L CC synth-2D]
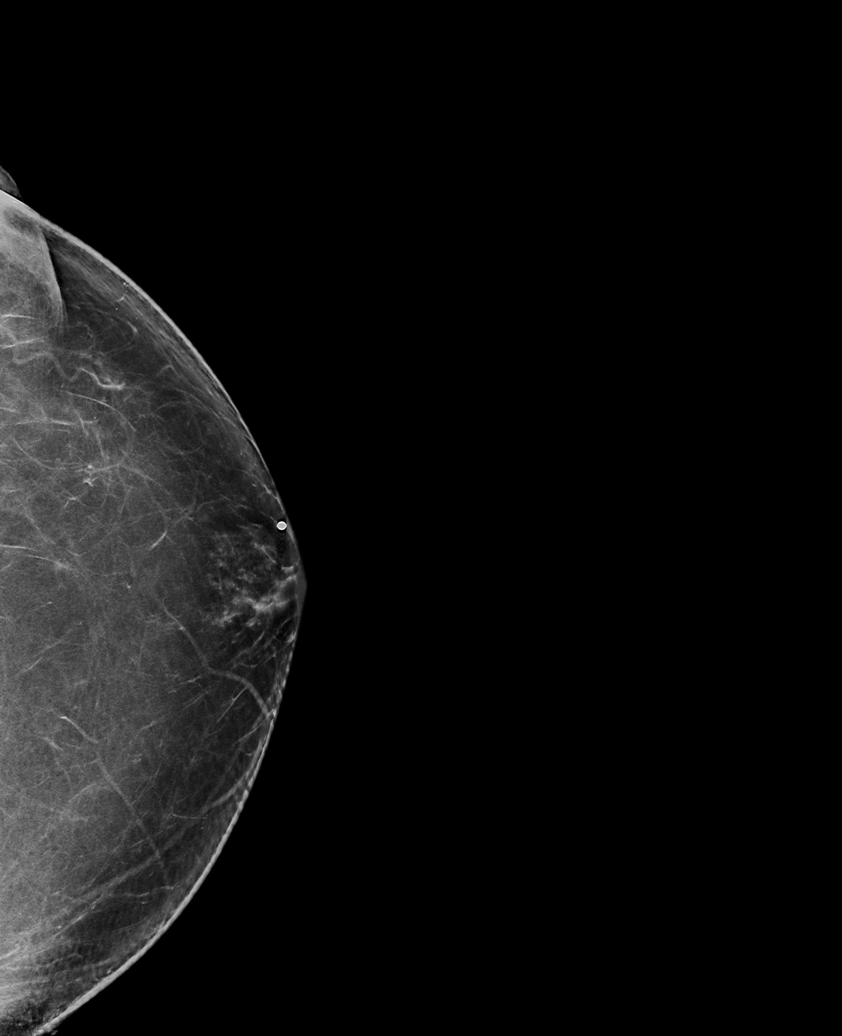

[L MLO synth-2D (1 of 2)]
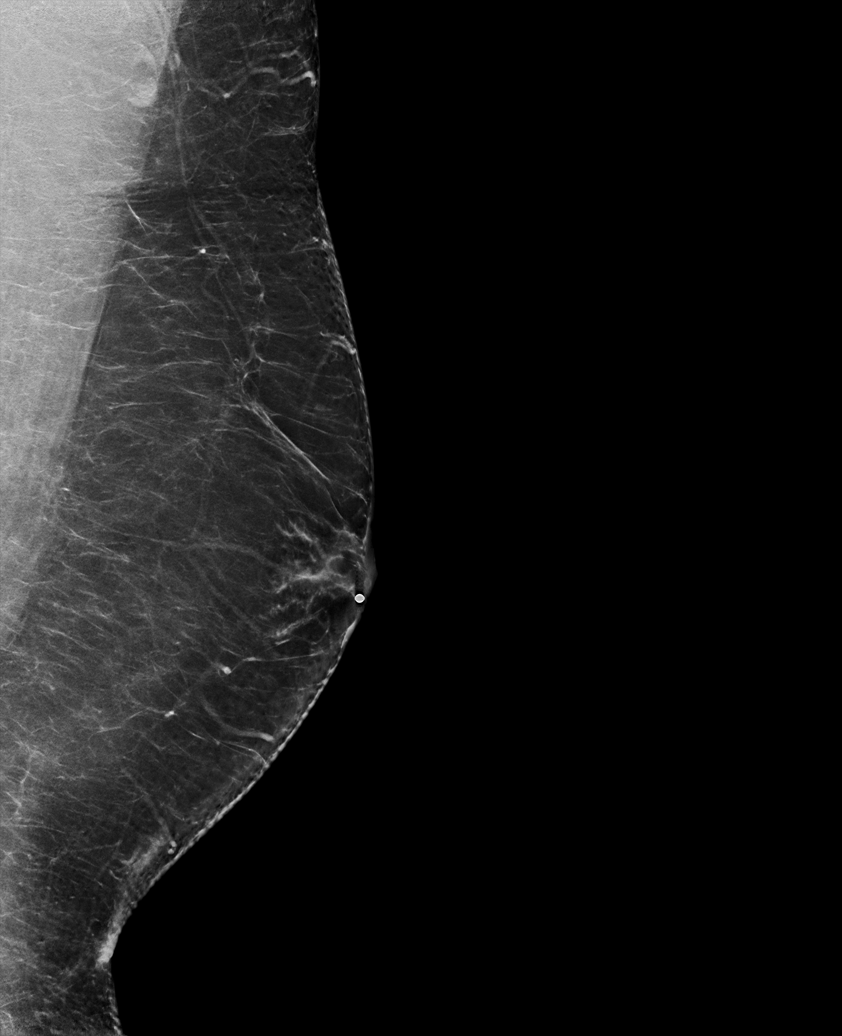

[R MLO synth-2D]
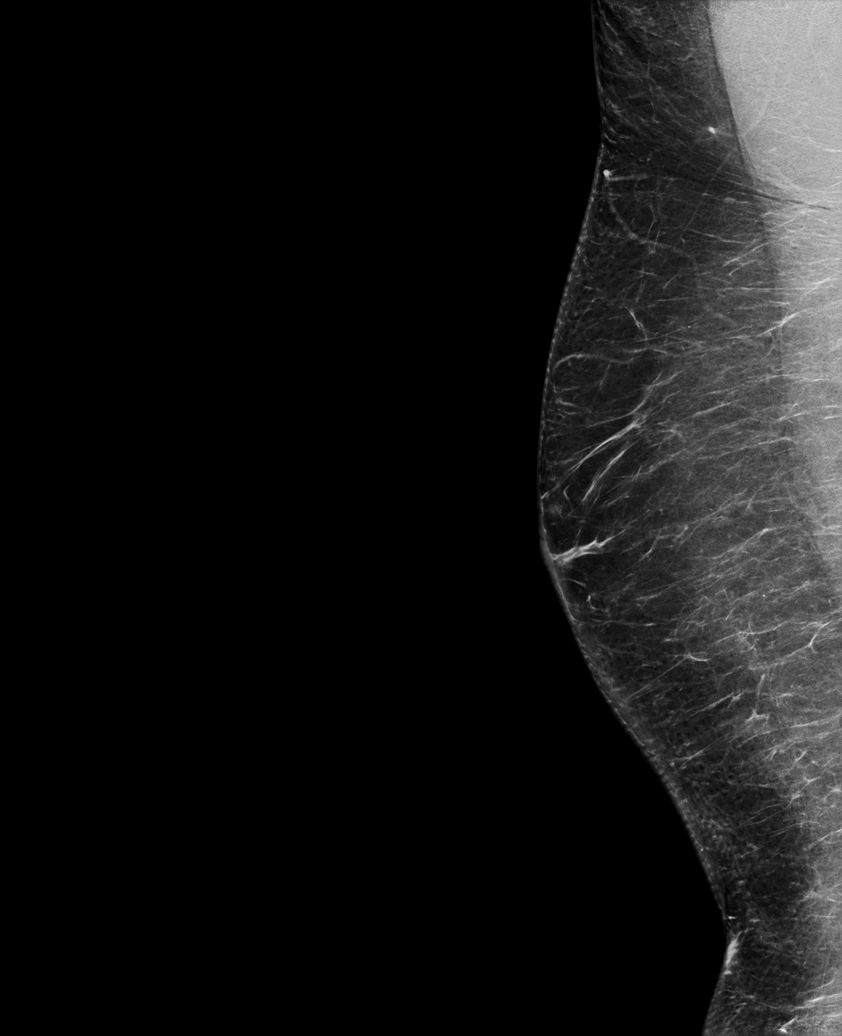

[L MLO synth-2D (2 of 2)]
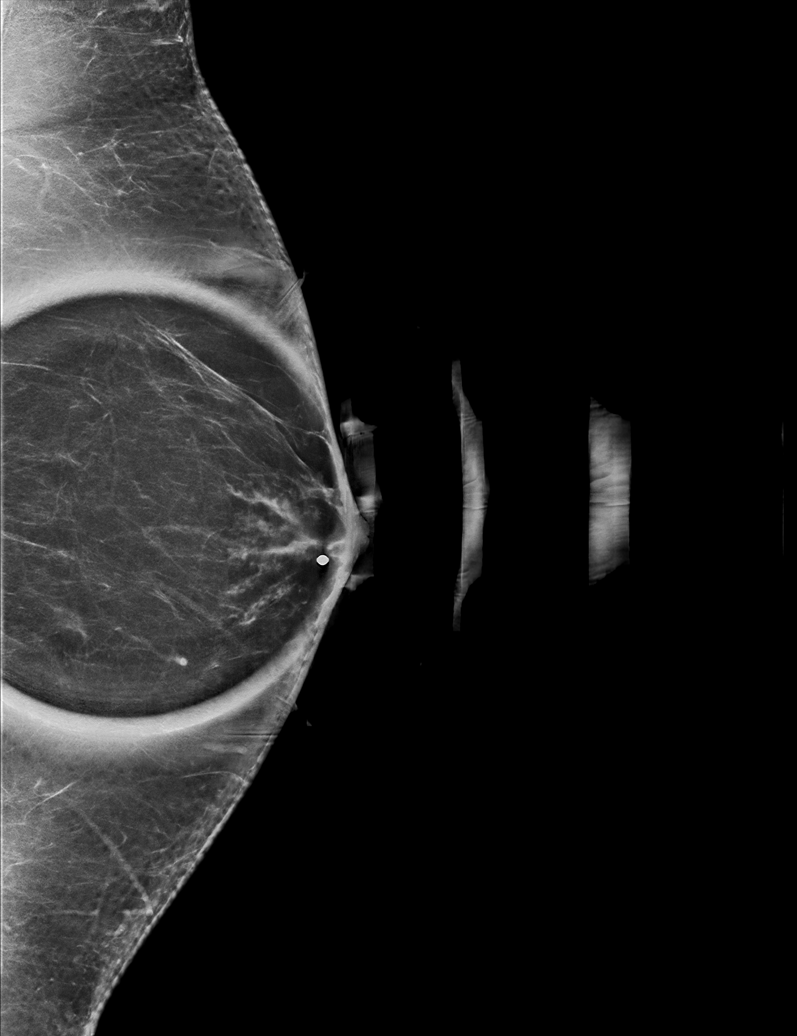

[R CC synth-2D]
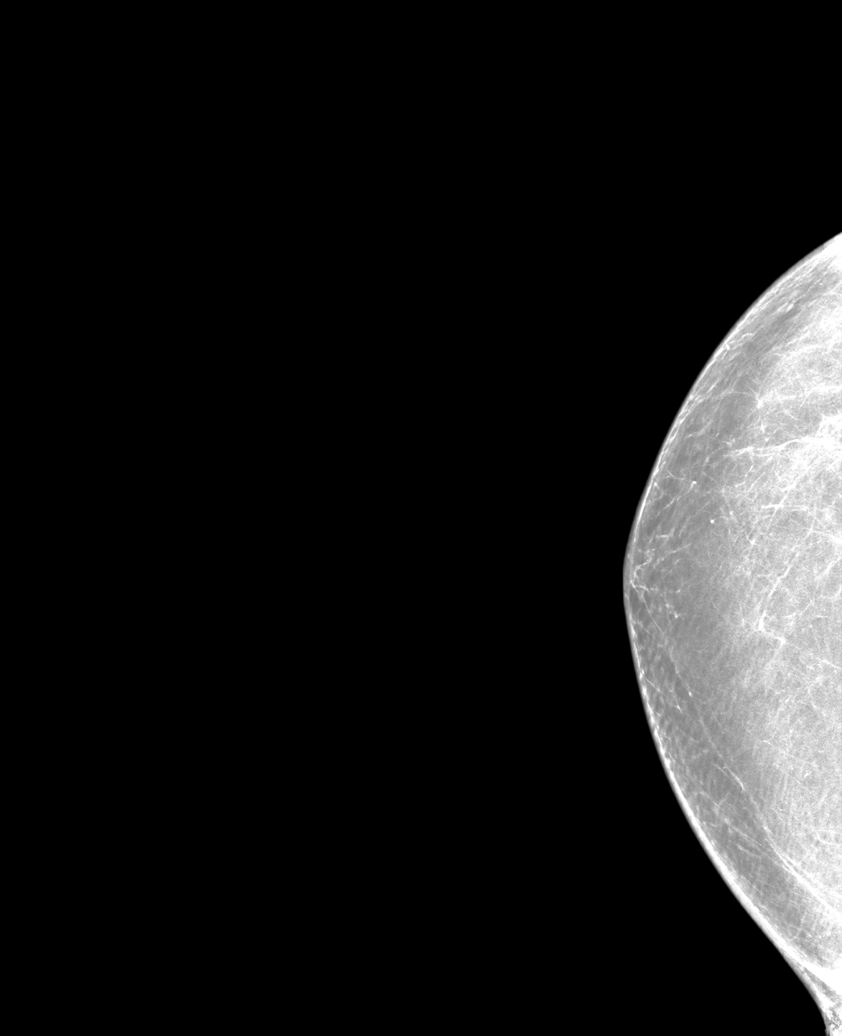

[L CC tomo · tomo slice 43/86.0]
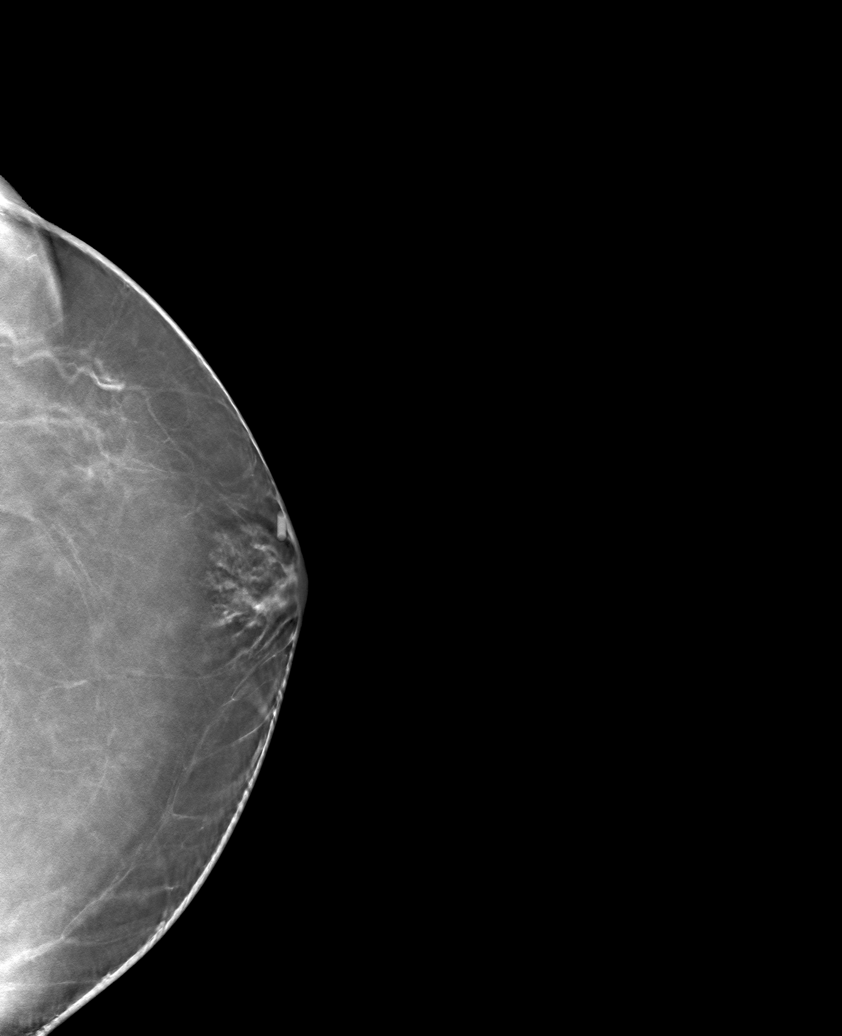

[6 of 30 positions shown; findings below may reference images not displayed]

FINDINGS: The patient is palpating gynecomastia on the left which is
relatively mild. No other suspicious findings in either breast.

Mammographic images were processed with CAD.
IMPRESSION: Left-sided gynecomastia. No evidence of malignancy in either breast.

RECOMMENDATION:
Treatment of the patient's gynecomastia should be based on clinical
and physical exam. No follow-up imaging necessary.

I have discussed the findings and recommendations with the patient.
If applicable, a reminder letter will be sent to the patient
regarding the next appointment.

BI-RADS CATEGORY  2: Benign.

## 2021-11-14 ENCOUNTER — Telehealth: Payer: Self-pay

## 2021-11-14 NOTE — Telephone Encounter (Signed)
Pt scheduled tomorrow @ 9:20

## 2021-11-14 NOTE — Telephone Encounter (Signed)
Received fax for surgery clearance, patient has not been seen since 2021. Please schedule appointment for surgery clearance, surgery scheduled 12/06/21.

## 2021-11-15 ENCOUNTER — Encounter: Payer: Self-pay | Admitting: Family Medicine

## 2021-11-15 ENCOUNTER — Ambulatory Visit (INDEPENDENT_AMBULATORY_CARE_PROVIDER_SITE_OTHER): Payer: No Typology Code available for payment source | Admitting: Family Medicine

## 2021-11-15 VITALS — BP 148/89 | HR 64 | Temp 97.9°F | Ht 72.0 in | Wt 327.8 lb

## 2021-11-15 DIAGNOSIS — Z125 Encounter for screening for malignant neoplasm of prostate: Secondary | ICD-10-CM

## 2021-11-15 DIAGNOSIS — Z23 Encounter for immunization: Secondary | ICD-10-CM

## 2021-11-15 DIAGNOSIS — Z13220 Encounter for screening for lipoid disorders: Secondary | ICD-10-CM

## 2021-11-15 DIAGNOSIS — Z01818 Encounter for other preprocedural examination: Secondary | ICD-10-CM

## 2021-11-15 DIAGNOSIS — Z114 Encounter for screening for human immunodeficiency virus [HIV]: Secondary | ICD-10-CM

## 2021-11-15 DIAGNOSIS — F4001 Agoraphobia with panic disorder: Secondary | ICD-10-CM | POA: Diagnosis not present

## 2021-11-15 DIAGNOSIS — Z1159 Encounter for screening for other viral diseases: Secondary | ICD-10-CM

## 2021-11-15 LAB — MICROSCOPIC EXAMINATION: Bacteria, UA: NONE SEEN

## 2021-11-15 LAB — URINALYSIS, ROUTINE W REFLEX MICROSCOPIC
Bilirubin, UA: NEGATIVE
Glucose, UA: NEGATIVE
Ketones, UA: NEGATIVE
Leukocytes,UA: NEGATIVE
Nitrite, UA: NEGATIVE
RBC, UA: NEGATIVE
Specific Gravity, UA: >1.03 — ABNORMAL HIGH (ref 1.005–1.030)
Urobilinogen, Ur: 0.2 mg/dL (ref 0.2–1.0)
pH, UA: 5.5 (ref 5.0–7.5)

## 2021-11-15 MED ORDER — LORAZEPAM 0.5 MG PO TABS: 0.5000 mg | ORAL_TABLET | Freq: Every day | ORAL | 0 refills | Status: AC | PRN

## 2021-11-15 NOTE — Progress Notes (Signed)
BP (!) 148/89   Pulse 64   Temp 97.9 F (36.6 C)   Ht 6' (1.829 m)   Wt (!) 327 lb 12.8 oz (148.7 kg)   SpO2 97%   BMI 44.46 kg/m    Subjective:    Patient ID: Adam Beard, male    DOB: April 30, 1970, 51 y.o.   MRN: [id number]  HPI: CLAUDIO GOMBERG is a 51 y.o. male  Chief Complaint  Patient presents with   Surgery Clearance    Patient has neck surgery scheduled for 12/06/21, here today for clearance.    To have cervical fusion in 2 weeks. He has had several surgeries in the past. The most recent being about 2013 and was a sinus surgery. He has never had any problems with anesthesia in the past. He has never had any problems with extubation. No post-op N/V. No family history of issues with anesthesia. No family history of malignant hyperthermia. He is otherwise doing well. No other concerns except for the issues due to his cervical radiculopathy. Physically feeling OK. No CP, no SOB, Able to walk up a couple of flights of stairs without SOB. No other concerns or complaints at this time.    Active Ambulatory Problems    Diagnosis Date Noted   Adjustment disorder with anxious mood 02/14/2017   Fear of travel with panic attacks 11/15/2021   Resolved Ambulatory Problems    Diagnosis Date Noted   Dizziness 11/16/2014   Abnormal EKG 11/16/2014   Sinusitis 01/11/2015   Past Medical History:  Diagnosis Date   Asthma    Hyperlipidemia    Hypertension    Sleep apnea    Past Surgical History:  Procedure Laterality Date   APPENDECTOMY     BACK SURGERY     Ruptured Disk   heart catheter  11/2011   LIPOMA EXCISION     Neck   NASAL SEPTUM SURGERY     Outpatient Encounter Medications as of 11/15/2021  Medication Sig   albuterol (VENTOLIN HFA) 108 (90 Base) MCG/ACT inhaler Inhale 2 puffs into the lungs every 6 (six) hours as needed for wheezing or shortness of breath.   fexofenadine (ALLEGRA) 180 MG tablet Take 180 mg by mouth daily.   LORazepam (ATIVAN) 0.5 MG tablet  Take 1 tablet (0.5 mg total) by mouth daily as needed for anxiety.   [DISCONTINUED] LORazepam (ATIVAN) 0.5 MG tablet Take 1 tablet (0.5 mg total) by mouth daily as needed for anxiety. (Patient not taking: Reported on 11/15/2021)   [DISCONTINUED] oxymetazoline (MUCINEX SINUS-MAX FULL FORCE) 0.05 % nasal spray Place 1 spray into both nostrils 2 (two) times daily. (Patient not taking: Reported on 11/15/2021)   [DISCONTINUED] triamcinolone ointment (KENALOG) 0.5 % Apply 1 application topically 2 (two) times daily. (Patient not taking: Reported on 11/15/2021)   No facility-administered encounter medications on file as of 11/15/2021.   No Known Allergies  Social History   Socioeconomic History   Marital status: Married    Spouse name: Not on file   Number of children: Not on file   Years of education: Not on file   Highest education level: Not on file  Occupational History   Not on file  Tobacco Use   Smoking status: Former   Smokeless tobacco: Former    Types: Nurse, children's Use: Never used  Substance and Sexual Activity   Alcohol use: Yes    Comment: one or less per year   Drug use: No  Sexual activity: Not on file  Other Topics Concern   Not on file  Social History Narrative   Not on file   Social Determinants of Health   Financial Resource Strain: Not on file  Food Insecurity: Not on file  Transportation Needs: Not on file  Physical Activity: Not on file  Stress: Not on file  Social Connections: Not on file   Family History  Problem Relation Age of Onset   Hypertension Mother    Cancer Father        Brain   Hypertension Father    Cancer Sister    Hypertension Brother      Review of Systems  Constitutional: Negative.   Respiratory: Negative.    Cardiovascular: Negative.   Musculoskeletal:  Positive for myalgias, neck pain and neck stiffness. Negative for arthralgias, back pain, gait problem and joint swelling.  Skin: Negative.   Neurological:   Positive for weakness and numbness. Negative for dizziness, tremors, seizures, syncope, facial asymmetry, speech difficulty, light-headedness and headaches.  Psychiatric/Behavioral: Negative.      Per HPI unless specifically indicated above     Objective:    BP (!) 148/89   Pulse 64   Temp 97.9 F (36.6 C)   Ht 6' (1.829 m)   Wt (!) 327 lb 12.8 oz (148.7 kg)   SpO2 97%   BMI 44.46 kg/m   Wt Readings from Last 3 Encounters:  11/15/21 (!) 327 lb 12.8 oz (148.7 kg)  10/29/19 (!) 320 lb (145.2 kg)  10/15/19 (!) 317 lb (143.8 kg)    Physical Exam Vitals and nursing note reviewed.  Constitutional:      General: He is not in acute distress.    Appearance: Normal appearance. He is obese. He is not ill-appearing, toxic-appearing or diaphoretic.  HENT:     Head: Normocephalic and atraumatic.     Right Ear: External ear normal.     Left Ear: External ear normal.     Nose: Nose normal.     Mouth/Throat:     Mouth: Mucous membranes are moist.     Pharynx: Oropharynx is clear.  Eyes:     General: No scleral icterus.       Right eye: No discharge.        Left eye: No discharge.     Extraocular Movements: Extraocular movements intact.     Conjunctiva/sclera: Conjunctivae normal.     Pupils: Pupils are equal, round, and reactive to light.  Cardiovascular:     Rate and Rhythm: Normal rate and regular rhythm.     Pulses: Normal pulses.     Heart sounds: Normal heart sounds. No murmur heard.    No friction rub. No gallop.  Pulmonary:     Effort: Pulmonary effort is normal. No respiratory distress.     Breath sounds: Normal breath sounds. No stridor. No wheezing, rhonchi or rales.  Chest:     Chest wall: No tenderness.  Musculoskeletal:        General: Normal range of motion.     Cervical back: Normal range of motion and neck supple.  Skin:    General: Skin is warm and dry.     Capillary Refill: Capillary refill takes less than 2 seconds.     Coloration: Skin is not jaundiced or  pale.     Findings: No bruising, erythema, lesion or rash.  Neurological:     General: No focal deficit present.     Mental Status: He is alert and  oriented to person, place, and time. Mental status is at baseline.  Psychiatric:        Mood and Affect: Mood normal.        Behavior: Behavior normal.        Thought Content: Thought content normal.        Judgment: Judgment normal.     Results for orders placed or performed in visit on 11/15/21  Microscopic Examination   Urine  Result Value Ref Range   WBC, UA 0-5 0 - 5 /hpf   RBC, Urine 0-2 0 - 2 /hpf   Epithelial Cells (non renal) 0-10 0 - 10 /hpf   Bacteria, UA None seen None seen/Few  Urinalysis, Routine w reflex microscopic  Result Value Ref Range   Specific Gravity, UA >1.030 (H) 1.005 - 1.030   pH, UA 5.5 5.0 - 7.5   Color, UA Yellow Yellow   Appearance Ur Clear Clear   Leukocytes,UA Negative Negative   Protein,UA 1+ (A) Negative/Trace   Glucose, UA Negative Negative   Ketones, UA Negative Negative   RBC, UA Negative Negative   Bilirubin, UA Negative Negative   Urobilinogen, Ur 0.2 0.2 - 1.0 mg/dL   Nitrite, UA Negative Negative   Microscopic Examination See below:       Assessment & Plan:   Problem List Items Addressed This Visit       Other   Fear of travel with panic attacks    Refill of lorazepam sent to the pharmacy today.      Relevant Medications   LORazepam (ATIVAN) 0.5 MG tablet   Other Visit Diagnoses     Preop exam for internal medicine    -  Primary   EKG shows RBBB, but otherwise normal. Normal METs. Will check labs. If normal will clear for surgery. Await results.    Relevant Orders   Comprehensive metabolic panel   CBC with Differential/Platelet   EKG 12-Lead (Completed)   Urinalysis, Routine w reflex microscopic (Completed)   INR/PT   Screening for cholesterol level       Labs drawn today. Await results. Treat as needed.    Relevant Orders   Lipid Panel w/o Chol/HDL Ratio    Screening for prostate cancer       Labs drawn today. Await results. Treat as needed.    Relevant Orders   PSA   Screening for HIV (human immunodeficiency virus)       Labs drawn today. Await results. Treat as needed.    Relevant Orders   HIV Antibody (routine testing w rflx)   Need for hepatitis C screening test       Labs drawn today. Await results. Treat as needed.    Relevant Orders   Hepatitis C Antibody   Need for diphtheria-tetanus-pertussis (Tdap) vaccine       Tdap given today.    Relevant Orders   Tdap vaccine greater than or equal to 7yo IM   Morbid obesity ([place])       Labs drawn today. Await results. Treat as needed. Encouraged diet and exercise with goal of losing 1-2lbs per week. Call with any concerns.    Relevant Orders   TSH        Follow up plan: Return 2-3 months physical.

## 2021-11-15 NOTE — Assessment & Plan Note (Signed)
Refill of lorazepam sent to the pharmacy today.

## 2021-11-16 LAB — TSH: TSH: 1.89 u[IU]/mL (ref 0.450–4.500)

## 2021-11-16 LAB — PSA: Prostate Specific Ag, Serum: 0.4 ng/mL (ref 0.0–4.0)

## 2021-11-16 LAB — COMPREHENSIVE METABOLIC PANEL
ALT: 47 IU/L — ABNORMAL HIGH (ref 0–44)
AST: 32 IU/L (ref 0–40)
Albumin/Globulin Ratio: 2.1 (ref 1.2–2.2)
Albumin: 4.7 g/dL (ref 3.8–4.9)
Alkaline Phosphatase: 81 IU/L (ref 44–121)
BUN/Creatinine Ratio: 18 (ref 9–20)
BUN: 17 mg/dL (ref 6–24)
Bilirubin Total: 0.6 mg/dL (ref 0.0–1.2)
CO2: 22 mmol/L (ref 20–29)
Calcium: 9.5 mg/dL (ref 8.7–10.2)
Chloride: 104 mmol/L (ref 96–106)
Creatinine, Ser: 0.96 mg/dL (ref 0.76–1.27)
Globulin, Total: 2.2 g/dL (ref 1.5–4.5)
Glucose: 126 mg/dL — ABNORMAL HIGH (ref 70–99)
Potassium: 4.4 mmol/L (ref 3.5–5.2)
Sodium: 138 mmol/L (ref 134–144)
Total Protein: 6.9 g/dL (ref 6.0–8.5)
eGFR: 95 mL/min/{1.73_m2} (ref >59)

## 2021-11-16 LAB — CBC WITH DIFFERENTIAL/PLATELET
Basophils Absolute: 0 10*3/uL (ref 0.0–0.2)
Basos: 0 %
EOS (ABSOLUTE): 0 10*3/uL (ref 0.0–0.4)
Eos: 0 %
Hematocrit: 48.5 % (ref 37.5–51.0)
Hemoglobin: 16.3 g/dL (ref 13.0–17.7)
Immature Grans (Abs): 0 10*3/uL (ref 0.0–0.1)
Immature Granulocytes: 0 %
Lymphocytes Absolute: 2 10*3/uL (ref 0.7–3.1)
Lymphs: 38 %
MCH: 30 pg (ref 26.6–33.0)
MCHC: 33.6 g/dL (ref 31.5–35.7)
MCV: 89 fL (ref 79–97)
Monocytes Absolute: 0.3 10*3/uL (ref 0.1–0.9)
Monocytes: 6 %
Neutrophils Absolute: 2.9 10*3/uL (ref 1.4–7.0)
Neutrophils: 56 %
Platelets: 202 10*3/uL (ref 150–450)
RBC: 5.43 x10E6/uL (ref 4.14–5.80)
RDW: 13.9 % (ref 11.6–15.4)
WBC: 5.2 10*3/uL (ref 3.4–10.8)

## 2021-11-16 LAB — PROTIME-INR
INR: 1 (ref 0.9–1.2)
Prothrombin Time: 10.7 s (ref 9.1–12.0)

## 2021-11-16 LAB — LIPID PANEL W/O CHOL/HDL RATIO
Cholesterol, Total: 216 mg/dL — ABNORMAL HIGH (ref 100–199)
HDL: 36 mg/dL — ABNORMAL LOW (ref >39)
LDL Chol Calc (NIH): 134 mg/dL — ABNORMAL HIGH (ref 0–99)
Triglycerides: 255 mg/dL — ABNORMAL HIGH (ref 0–149)
VLDL Cholesterol Cal: 46 mg/dL — ABNORMAL HIGH (ref 5–40)

## 2021-11-16 LAB — HEPATITIS C ANTIBODY: Hep C Virus Ab: NONREACTIVE

## 2021-11-16 LAB — HIV ANTIBODY (ROUTINE TESTING W REFLEX): HIV Screen 4th Generation wRfx: NONREACTIVE

## 2021-11-16 NOTE — Progress Notes (Signed)
Patient notified of results  by mychart- please send clearance to his surgeon  Labs normal. OK to be cleared for surgery

## 2021-11-17 NOTE — Progress Notes (Signed)
Interpreted by me 11/15/21. RBBB with poor baseline. 74bpm. No ST segment changes.

## 2022-01-16 ENCOUNTER — Encounter: Payer: No Typology Code available for payment source | Admitting: Family Medicine

## 2022-09-14 ENCOUNTER — Encounter: Payer: Self-pay | Admitting: *Deleted

## 2022-09-21 ENCOUNTER — Ambulatory Visit
Admission: RE | Admit: 2022-09-21 | Discharge: 2022-09-21 | Disposition: A | Discharge status: Home / Self Care | Payer: No Typology Code available for payment source | Source: Ambulatory Visit | Attending: Gastroenterology | Admitting: Gastroenterology

## 2022-09-21 ENCOUNTER — Encounter
Admission: RE | Disposition: A | Discharge status: Home / Self Care | Payer: Self-pay | Source: Ambulatory Visit | Attending: Gastroenterology

## 2022-09-21 ENCOUNTER — Encounter: Payer: Self-pay | Admitting: *Deleted

## 2022-09-21 ENCOUNTER — Ambulatory Visit: Payer: No Typology Code available for payment source | Admitting: Registered Nurse

## 2022-09-21 DIAGNOSIS — K5730 Diverticulosis of large intestine without perforation or abscess without bleeding: Secondary | ICD-10-CM | POA: Diagnosis not present

## 2022-09-21 DIAGNOSIS — K640 First degree hemorrhoids: Secondary | ICD-10-CM | POA: Diagnosis not present

## 2022-09-21 DIAGNOSIS — Z800 Family history of malignant neoplasm of digestive organs: Secondary | ICD-10-CM | POA: Diagnosis not present

## 2022-09-21 DIAGNOSIS — D124 Benign neoplasm of descending colon: Secondary | ICD-10-CM | POA: Diagnosis not present

## 2022-09-21 DIAGNOSIS — Z1211 Encounter for screening for malignant neoplasm of colon: Secondary | ICD-10-CM | POA: Diagnosis present

## 2022-09-21 HISTORY — PX: COLONOSCOPY WITH PROPOFOL: SHX5780

## 2022-09-21 HISTORY — PX: POLYPECTOMY: SHX5525

## 2022-09-21 SURGERY — COLONOSCOPY WITH PROPOFOL
Anesthesia: General

## 2022-09-21 MED ORDER — LIDOCAINE HCL (CARDIAC) PF 100 MG/5ML IV SOSY
PREFILLED_SYRINGE | INTRAVENOUS | Status: DC | PRN
  Administered 2022-09-21: 20 mg via INTRAVENOUS

## 2022-09-21 MED ORDER — SODIUM CHLORIDE 0.9 % IV SOLN
INTRAVENOUS | Status: DC
  Administered 2022-09-21: 1000 mL via INTRAVENOUS

## 2022-09-21 MED ORDER — PROPOFOL 10 MG/ML IV BOLUS
INTRAVENOUS | Status: DC | PRN
  Administered 2022-09-21: 100 mg via INTRAVENOUS

## 2022-09-21 MED ORDER — PROPOFOL 500 MG/50ML IV EMUL
INTRAVENOUS | Status: DC | PRN
  Administered 2022-09-21: 125 ug/kg/min via INTRAVENOUS

## 2022-09-21 MED ORDER — PROPOFOL 1000 MG/100ML IV EMUL
INTRAVENOUS | Status: AC
  Filled 2022-09-21: qty 100

## 2022-09-21 NOTE — Op Note (Signed)
[facility] Gastroenterology Patient Name: Adam Beard Procedure Date: 09/21/2022 12:31 PM MRN: [id number] Account #: [id number] Date of Birth: 14-Nov-1970 Admit Type: Outpatient Age: 52 Room: [facility] ENDO ROOM 3 Gender: Male Note Status: Finalized Instrument Name: Nelda Marseille [id number] Procedure:             Colonoscopy Indications:           Screening for colorectal malignant neoplasm Providers:             Eather Colas MD, MD Medicines:             Monitored Anesthesia Care Complications:         No immediate complications. Estimated blood loss:                         Minimal. Procedure:             Pre-Anesthesia Assessment:                        - Prior to the procedure, a History and Physical was                         performed, and patient medications and allergies were                         reviewed. The patient is competent. The risks and                         benefits of the procedure and the sedation options and                         risks were discussed with the patient. All questions                         were answered and informed consent was obtained.                         Patient identification and proposed procedure were                         verified by the physician, the nurse, the                         anesthesiologist, the anesthetist and the technician                         in the endoscopy suite. Mental Status Examination:                         alert and oriented. Airway Examination: normal                         oropharyngeal airway and neck mobility. Respiratory                         Examination: clear to auscultation. CV Examination:                         normal. Prophylactic Antibiotics: The patient does not  require prophylactic antibiotics. Prior                         Anticoagulants: The patient has taken no anticoagulant                         or antiplatelet agents. ASA Grade  Assessment: II - A                         patient with mild systemic disease. After reviewing                         the risks and benefits, the patient was deemed in                         satisfactory condition to undergo the procedure. The                         anesthesia plan was to use monitored anesthesia care                         (MAC). Immediately prior to administration of                         medications, the patient was re-assessed for adequacy                         to receive sedatives. The heart rate, respiratory                         rate, oxygen saturations, blood pressure, adequacy of                         pulmonary ventilation, and response to care were                         monitored throughout the procedure. The physical                         status of the patient was re-assessed after the                         procedure.                        After obtaining informed consent, the colonoscope was                         passed under direct vision. Throughout the procedure,                         the patient's blood pressure, pulse, and oxygen                         saturations were monitored continuously. The                         Colonoscope was introduced through the anus and  advanced to the the cecum, identified by appendiceal                         orifice and ileocecal valve. The colonoscopy was                         performed without difficulty. The patient tolerated                         the procedure well. The quality of the bowel                         preparation was adequate to identify polyps. The                         ileocecal valve, appendiceal orifice, and rectum were                         photographed. Findings:      The perianal and digital rectal examinations were normal.      A 7 mm polyp was found in the descending colon. The polyp was       semi-pedunculated. The polyp was removed with a  cold snare. Resection       and retrieval were complete. Estimated blood loss was minimal.      A few small-mouthed diverticula were found in the sigmoid colon.      Internal hemorrhoids were found during retroflexion. The hemorrhoids       were Grade I (internal hemorrhoids that do not prolapse).      The exam was otherwise without abnormality on direct and retroflexion       views. Impression:            - One 7 mm polyp in the descending colon, removed with                         a cold snare. Resected and retrieved.                        - Diverticulosis in the sigmoid colon.                        - Internal hemorrhoids.                        - The examination was otherwise normal on direct and                         retroflexion views. Recommendation:        - Discharge patient to home.                        - Resume previous diet.                        - Continue present medications.                        - Await pathology results.                        -  Repeat colonoscopy for surveillance based on                         pathology results.                        - Return to referring physician as previously                         scheduled. Procedure Code(s):     --- Professional ---                        [phone], Colonoscopy, flexible; with removal of                         tumor(s), polyp(s), or other lesion(s) by snare                         technique Diagnosis Code(s):     --- Professional ---                        Z12.11, Encounter for screening for malignant neoplasm                         of colon                        D12.4, Benign neoplasm of descending colon                        K64.0, First degree hemorrhoids                        K57.30, Diverticulosis of large intestine without                         perforation or abscess without bleeding CPT copyright 2022 American Medical Association. All rights reserved. The codes documented in this report  are preliminary and upon coder review may  be revised to meet current compliance requirements. Eather Colas MD, MD 09/21/2022 1:08:04 PM Number of Addenda: 0 Note Initiated On: 09/21/2022 12:31 PM Scope Withdrawal Time: 0 hours 10 minutes 15 seconds  Total Procedure Duration: 0 hours 16 minutes 7 seconds  Estimated Blood Loss:  Estimated blood loss was minimal.      [facility]

## 2022-09-21 NOTE — H&P (Signed)
Outpatient short stay form Pre-procedure 09/21/2022  Regis Bill, MD  Primary Physician: Patient, No Pcp Per  Reason for visit:  Screening  History of present illness:    52 y/o gentleman with history of hypertension and cervical spinal fusion here for index screening colonoscopy. No blood thinners. History of appendectomy. Uncle and grandfather with colon cancer > 60.   Current Facility-Administered Medications:    0.9 %  sodium chloride infusion, , Intravenous, Continuous, Araya Roel, Rossie Muskrat, MD, Last Rate: 20 mL/hr at 09/21/22 1205, 1,000 mL at 09/21/22 1205  Medications Prior to Admission  Medication Sig Dispense Refill Last Dose   fexofenadine (ALLEGRA) 180 MG tablet Take 180 mg by mouth daily.   Past Week   LORazepam (ATIVAN) 0.5 MG tablet Take 1 tablet (0.5 mg total) by mouth daily as needed for anxiety. 30 tablet 0 Past Week   Semaglutide, 1 MG/DOSE, 2 MG/1.5ML SOPN Inject into the skin.   09/10/2022   albuterol (VENTOLIN HFA) 108 (90 Base) MCG/ACT inhaler Inhale 2 puffs into the lungs every 6 (six) hours as needed for wheezing or shortness of breath. 1 each 6  at prn     No Known Allergies   Past Medical History:  Diagnosis Date   Asthma    Hyperlipidemia    Hypertension    Sleep apnea     Review of systems:  Otherwise negative.    Physical Exam  Gen: Alert, oriented. Appears stated age.  HEENT: PERRLA. Lungs: No respiratory distress CV: RRR Abd: soft, benign, no masses Ext: No edema    Planned procedures: Proceed with colonoscopy. The patient understands the nature of the planned procedure, indications, risks, alternatives and potential complications including but not limited to bleeding, infection, perforation, damage to internal organs and possible oversedation/side effects from anesthesia. The patient agrees and gives consent to proceed.  Please refer to procedure notes for findings, recommendations and patient disposition/instructions.      Regis Bill, MD [facility]

## 2022-09-21 NOTE — Transfer of Care (Signed)
Immediate Anesthesia Transfer of Care Note  Patient: Adam Beard  Procedure(s) Performed: COLONOSCOPY WITH PROPOFOL POLYPECTOMY  Patient Location: PACU  Anesthesia Type:General  Level of Consciousness: drowsy  Airway & Oxygen Therapy: Patient Spontanous Breathing and Patient connected to nasal cannula oxygen  Post-op Assessment: Report given to RN and Post -op Vital signs reviewed and stable  Post vital signs: Reviewed and stable  Last Vitals:  Vitals Value Taken Time  BP 112/71 09/21/22 1306  Temp 97.5   Pulse 77 09/21/22 1307  Resp 15 09/21/22 1307  SpO2 97 % 09/21/22 1307  Vitals shown include unfiled device data.  Last Pain:  Vitals:   09/21/22 1147  TempSrc: Temporal  PainSc: 0-No pain         Complications: No notable events documented.

## 2022-09-21 NOTE — Anesthesia Postprocedure Evaluation (Signed)
Anesthesia Post Note  Patient: Adam Beard  Procedure(s) Performed: COLONOSCOPY WITH PROPOFOL POLYPECTOMY  Patient location during evaluation: Endoscopy Anesthesia Type: General Level of consciousness: awake and alert Pain management: pain level controlled Vital Signs Assessment: post-procedure vital signs reviewed and stable Respiratory status: spontaneous breathing, nonlabored ventilation, respiratory function stable and patient connected to nasal cannula oxygen Cardiovascular status: blood pressure returned to baseline and stable Postop Assessment: no apparent nausea or vomiting Anesthetic complications: no   No notable events documented.   Last Vitals:  Vitals:   09/21/22 1147 09/21/22 1306  BP: (!) 151/91 112/71  Pulse: 62 73  Resp: 18 20  Temp: (!) 36.1 C 36.8 C  SpO2: 97% 97%    Last Pain:  Vitals:   09/21/22 1319  TempSrc:   PainSc: 0-No pain                 Louie Boston

## 2022-09-21 NOTE — Interval H&P Note (Signed)
History and Physical Interval Note:  09/21/2022 12:39 PM  Adam Beard  has presented today for surgery, with the diagnosis of Z12.11 (ICD-10-CM) - Colon cancer screening.  The various methods of treatment have been discussed with the patient and family. After consideration of risks, benefits and other options for treatment, the patient has consented to  Procedure(s): COLONOSCOPY WITH PROPOFOL (N/A) as a surgical intervention.  The patient's history has been reviewed, patient examined, no change in status, stable for surgery.  I have reviewed the patient's chart and labs.  Questions were answered to the patient's satisfaction.     Regis Bill  Ok to proceed with colonoscopy

## 2022-09-21 NOTE — Anesthesia Preprocedure Evaluation (Signed)
Anesthesia Evaluation  Patient identified by MRN, date of birth, ID band Patient awake    Reviewed: Allergy & Precautions, NPO status , Patient's Chart, lab work & pertinent test results  History of Anesthesia Complications Negative for: history of anesthetic complications  Airway Mallampati: III  TM Distance: >3 FB Neck ROM: full    Dental no notable dental hx.    Pulmonary asthma , sleep apnea and Continuous Positive Airway Pressure Ventilation , former smoker   Pulmonary exam normal        Cardiovascular hypertension, On Medications negative cardio ROS Normal cardiovascular exam     Neuro/Psych  PSYCHIATRIC DISORDERS Anxiety     negative neurological ROS     GI/Hepatic negative GI ROS, Neg liver ROS,,,  Endo/Other  negative endocrine ROS    Renal/GU negative Renal ROS  negative genitourinary   Musculoskeletal   Abdominal   Peds  Hematology negative hematology ROS (+)   Anesthesia Other Findings Past Medical History: No date: Asthma No date: Hyperlipidemia No date: Hypertension No date: Sleep apnea  Past Surgical History: No date: ANTERIOR CERVICAL DECOMP/DISCECTOMY FUSION No date: APPENDECTOMY No date: BACK SURGERY     Comment:  Ruptured Disk 11/30/2011: heart catheter No date: LIPOMA EXCISION     Comment:  Neck No date: NASAL SEPTUM SURGERY  BMI    Body Mass Index: 36.42 kg/m      Reproductive/Obstetrics negative OB ROS                             Anesthesia Physical Anesthesia Plan  ASA: 2  Anesthesia Plan: General   Post-op Pain Management: Minimal or no pain anticipated   Induction: Intravenous  PONV Risk Score and Plan: 1 and Propofol infusion and TIVA  Airway Management Planned: Natural Airway and Nasal Cannula  Additional Equipment:   Intra-op Plan:   Post-operative Plan:   Informed Consent: I have reviewed the patients History and Physical,  chart, labs and discussed the procedure including the risks, benefits and alternatives for the proposed anesthesia with the patient or authorized representative who has indicated his/her understanding and acceptance.     Dental Advisory Given  Plan Discussed with: Anesthesiologist, CRNA and Surgeon  Anesthesia Plan Comments: (Patient consented for risks of anesthesia including but not limited to:  - adverse reactions to medications - risk of airway placement if required - damage to eyes, teeth, lips or other oral mucosa - nerve damage due to positioning  - sore throat or hoarseness - Damage to heart, brain, nerves, lungs, other parts of body or loss of life  Patient voiced understanding.)       Anesthesia Quick Evaluation

## 2022-09-24 ENCOUNTER — Encounter: Payer: Self-pay | Admitting: Gastroenterology
